# Patient Record
Sex: Male | Born: 1950
Health system: Southern US, Community
[De-identification: ages and names within clinical notes are randomized; demographics above are authoritative.]

## PROBLEM LIST (undated history)

## (undated) DIAGNOSIS — E785 Hyperlipidemia, unspecified: Secondary | ICD-10-CM

## (undated) DIAGNOSIS — I2510 Atherosclerotic heart disease of native coronary artery without angina pectoris: Secondary | ICD-10-CM

## (undated) DIAGNOSIS — G4752 REM sleep behavior disorder: Secondary | ICD-10-CM

## (undated) DIAGNOSIS — I119 Hypertensive heart disease without heart failure: Secondary | ICD-10-CM

## (undated) DIAGNOSIS — K635 Polyp of colon: Secondary | ICD-10-CM

## (undated) DIAGNOSIS — S5290XA Unspecified fracture of unspecified forearm, initial encounter for closed fracture: Secondary | ICD-10-CM

## (undated) DIAGNOSIS — K5790 Diverticulosis of intestine, part unspecified, without perforation or abscess without bleeding: Secondary | ICD-10-CM

## (undated) HISTORY — DX: Polyp of colon: K63.5

## (undated) HISTORY — PX: COLONOSCOPY: SHX174

## (undated) HISTORY — DX: Hypertensive heart disease without heart failure: I11.9

## (undated) HISTORY — DX: Hyperlipidemia, unspecified: E78.5

## (undated) HISTORY — DX: Unspecified fracture of unspecified forearm, initial encounter for closed fracture: S52.90XA

## (undated) HISTORY — PX: CORONARY STENT PLACEMENT: SHX1402

## (undated) HISTORY — DX: REM sleep behavior disorder: G47.52

## (undated) HISTORY — PX: WISDOM TOOTH EXTRACTION: SHX21

## (undated) HISTORY — PX: OTHER SURGICAL HISTORY: SHX169

## (undated) HISTORY — DX: Atherosclerotic heart disease of native coronary artery without angina pectoris: I25.10

## (undated) HISTORY — DX: Diverticulosis of intestine, part unspecified, without perforation or abscess without bleeding: K57.90

---

## 2005-01-28 ENCOUNTER — Ambulatory Visit: Payer: Self-pay | Admitting: Internal Medicine

## 2005-02-17 ENCOUNTER — Encounter (INDEPENDENT_AMBULATORY_CARE_PROVIDER_SITE_OTHER): Payer: Self-pay | Admitting: *Deleted

## 2005-02-17 ENCOUNTER — Ambulatory Visit: Payer: Self-pay | Admitting: Internal Medicine

## 2010-02-01 ENCOUNTER — Encounter: Payer: Self-pay | Admitting: Internal Medicine

## 2010-03-15 ENCOUNTER — Encounter (INDEPENDENT_AMBULATORY_CARE_PROVIDER_SITE_OTHER): Payer: Self-pay | Admitting: *Deleted

## 2010-03-18 ENCOUNTER — Ambulatory Visit: Payer: Self-pay | Admitting: Internal Medicine

## 2010-04-02 ENCOUNTER — Ambulatory Visit: Payer: Self-pay | Admitting: Internal Medicine

## 2010-04-06 ENCOUNTER — Encounter: Payer: Self-pay | Admitting: Internal Medicine

## 2010-08-17 NOTE — Procedures (Signed)
Summary: Colonoscopy  Patient: [name] Note: All result statuses are Final unless otherwise noted.  Tests: (1) Colonoscopy (COL)   COL Colonoscopy           DONE     [facility]     [address]     [place], [place]  [address]           COLONOSCOPY PROCEDURE REPORT           PATIENT:  [name], [name]  MR#:  [id number]     BIRTHDATE:  [birth year], 59 yrs. old  GENDER:  male     ENDOSCOPIST:  Wilhemina Bonito. Eda Keys, MD     REF. BY:  Surveillance Program Recall,     PROCEDURE DATE:  04/02/2010     PROCEDURE:  Colonoscopy with snare polypectomy x 1     ASA CLASS:  Class I     INDICATIONS:  history of polyps, surveillance and high-risk     screening ; 02-2005 - SMALL POLYP w/o path     MEDICATIONS:   Fentanyl 100 mcg IV, Versed 10 mg IV, Benadryl 50     mg IV           DESCRIPTION OF PROCEDURE:   After the risks benefits and     alternatives of the procedure were thoroughly explained, informed     consent was obtained.  Digital rectal exam was performed and     revealed no abnormalities.   The [id number] [id number] endoscope was     introduced through the anus and advanced to the cecum, which was     identified by both the appendix and ileocecal valve, without     limitations.Time to cecum = 3:15 min.   The quality of the prep     was excellent, using MoviPrep.  The instrument was then slowly     withdrawn (time = 12:01 min) as the colon was fully examined.     <<PROCEDUREIMAGES>>           FINDINGS:  A diminutive polyp was found in the ascending colon.     Polyp was snared without cautery. Retrieval was successful.  Mild     diverticulosis was found in the sigmoid colon.  This was otherwise     a normal examination of the colon.   Retroflexed views in the     rectum revealed no abnormalities.    The scope was then withdrawn     from the patient and the procedure completed.           COMPLICATIONS:  None     ENDOSCOPIC IMPRESSION:     1) Diminutive polyp in the ascending colon -  removed     2) Mild diverticulosis in the sigmoid colon     3) Otherwise normal examination           RECOMMENDATIONS:     1) Repeat colonoscopy in 5 years if polyp adenomatous; otherwise     10 years           ______________________________     Wilhemina Bonito. Eda Keys, MD           CC:  Creola Corn, MD; The Patient           n.     eSIGNED:   Wilhemina Bonito. Eda Keys at 04/02/2010 12:47 PM           [name], [id number]  Note: An exclamation Karlin (!) indicates a result  that was not dispersed into the flowsheet. Document Creation Date: 04/02/2010 12:48 PM _______________________________________________________________________  (1) Order result status: Final Collection or observation date-time: 04/02/2010 12:39 Requested date-time:  Receipt date-time:  Reported date-time:  Referring Physician:   Ordering Physician: Fransico Setters [phone]) Specimen Source:  Source: Launa Grill Order Number: [id number] Lab site:   Appended Document: Colonoscopy recall     Procedures Next Due Date:    Colonoscopy: 03/2015

## 2010-08-17 NOTE — Miscellaneous (Signed)
Summary: [facility]  Clinical Lists Changes  Medications: Added new medication of MOVIPREP 100 GM  SOLR (PEG-KCL-NACL-NASULF-NA ASC-C) As per prep instructions. - Signed Rx of MOVIPREP 100 GM  SOLR (PEG-KCL-NACL-NASULF-NA ASC-C) As per prep instructions.;  #1 x 0;  Signed;  Entered by: Ezra Sites RN;  Authorized by: Hilarie Fredrickson MD;  Method used: [facility] [phone]*, [address] [place], [place], [place]  [address], Ph: [phone] or [phone], Fax: [phone] Observations: Added new observation of NKA: T (03/18/2010 8:36)    Prescriptions: MOVIPREP 100 GM  SOLR (PEG-KCL-NACL-NASULF-NA ASC-C) As per prep instructions.  #1 x 0   Entered by:   Ezra Sites RN   Authorized by:   Hilarie Fredrickson MD   Signed by:   Ezra Sites RN on 03/18/2010   Method used:   Electronically to        [phone][facility] (retail)       [address]       [place], [place]  [address]       Ph: [phone] or [phone]       Fax: [phone]   RxID:   [id number]

## 2010-08-17 NOTE — Letter (Signed)
Summary: Moviprep Instructions  [facility]  [address]   [place], [place] [address]   Phone: [phone]  Fax: [phone]       Tionne Manship    1951-06-18    MRN: [id number]        Procedure Day Dorna Bloom: Friday, 04-02-10     Arrival Time: 10:00 a.m.     Procedure Time: 11:00 a.m.     Location of Procedure:                    x   [facility] (4th Floor)                        PREPARATION FOR COLONOSCOPY WITH MOVIPREP   Starting 5 days prior to your procedure 03-28-10 do not eat nuts, seeds, popcorn, corn, beans, peas,  salads, or any raw vegetables.  Do not take any fiber supplements (e.g. Metamucil, Citrucel, and Benefiber).  THE DAY BEFORE YOUR PROCEDURE         DATE:  04-01-10   DAY: Thursday  1.  Drink clear liquids the entire day-NO SOLID FOOD  2.  Do not drink anything colored red or purple.  Avoid juices with pulp.  No orange juice.  3.  Drink at least 64 oz. (8 glasses) of fluid/clear liquids during the day to prevent dehydration and help the prep work efficiently.  CLEAR LIQUIDS INCLUDE: Water Jello Ice Popsicles Tea (sugar ok, no milk/cream) Powdered fruit flavored drinks Coffee (sugar ok, no milk/cream) Gatorade Juice: apple, white grape, white cranberry  Lemonade Clear bullion, consomm, broth Carbonated beverages (any kind) Strained chicken noodle soup Hard Candy                             4.  In the morning, mix first dose of MoviPrep solution:    Empty 1 Pouch A and 1 Pouch B into the disposable container    Add lukewarm drinking water to the top line of the container. Mix to dissolve    Refrigerate (mixed solution should be used within 24 hrs)  5.  Begin drinking the prep at 5:00 p.m. The MoviPrep container is divided by 4 marks.   Every 15 minutes drink the solution down to the next Horacio (approximately 8 oz) until the full liter is complete.   6.  Follow completed prep with 16 oz of clear liquid of your choice  (Nothing red or purple).  Continue to drink clear liquids until bedtime.  7.  Before going to bed, mix second dose of MoviPrep solution:    Empty 1 Pouch A and 1 Pouch B into the disposable container    Add lukewarm drinking water to the top line of the container. Mix to dissolve    Refrigerate  THE DAY OF YOUR PROCEDURE      DATE: 04-02-10  DAY: Friday  Beginning at 6:00 a.m. (5 hours before procedure):         1. Every 15 minutes, drink the solution down to the next Tracie (approx 8 oz) until the full liter is complete.  2. Follow completed prep with 16 oz. of clear liquid of your choice.    3. You may drink clear liquids until  9:00 a.m.  (2 HOURS BEFORE PROCEDURE).   MEDICATION INSTRUCTIONS  Unless otherwise instructed, you should take regular prescription medications with a small sip of water   as early  as possible the morning of your procedure.          OTHER INSTRUCTIONS  You will need a responsible adult at least 59 years of age to accompany you and drive you home.   This person must remain in the waiting room during your procedure.  Wear loose fitting clothing that is easily removed.  Leave jewelry and other valuables at home.  However, you may wish to bring a book to read or  an iPod/MP3 player to listen to music as you wait for your procedure to start.  Remove all body piercing jewelry and leave at home.  Total time from sign-in until discharge is approximately 2-3 hours.  You should go home directly after your procedure and rest.  You can resume normal activities the  day after your procedure.  The day of your procedure you should not:   Drive   Make legal decisions   Operate machinery   Drink alcohol   Return to work  You will receive specific instructions about eating, activities and medications before you leave.    The above instructions have been reviewed and explained to me by  Ezra Sites RN  March 18, 2010 9:15 AM      I fully  understand and can verbalize these instructions _____________________________ Date _________

## 2010-08-17 NOTE — Letter (Signed)
Summary: Patient Notice- Polyp Results  [facility]  [address] [place], [place] [address]   Phone: [phone]  Fax: [phone]        April 06, 2010 MRN: [id number]    [facility] [address] [place], [place]  [address]    Dear Mr. RIDDLE,  I am pleased to inform you that the colon polyp(s) removed during your recent colonoscopy was (were) found to be benign (no cancer detected) upon pathologic examination.  I recommend you have a repeat colonoscopy examination in 5 years to look for recurrent polyps, as having colon polyps increases your risk for having recurrent polyps or even colon cancer in the future.  Should you develop new or worsening symptoms of abdominal pain, bowel habit changes or bleeding from the rectum or bowels, please schedule an evaluation with either your primary care physician or with me.  Additional information/recommendations:  __ No further action with gastroenterology is needed at this time. Please      follow-up with your primary care physician for your other healthcare      needs.    Please call us if you are having persistent problems or have questions about your condition that have not been fully answered at this time.  Sincerely,  Hilarie Fredrickson MD  This letter has been electronically signed by your physician.  Appended Document: Patient Notice- Polyp Results letter mailed

## 2010-08-17 NOTE — Letter (Signed)
Summary: Colonoscopy Letter  [facility]  [address] [place], [place] [address]   Phone: [phone]  Fax: [phone]      February 01, 2010 MRN: [id number]   [facility] [address] [place], [place]  [address]   Dear [name],   According to your medical record, it is time for you to schedule a Colonoscopy. The American Cancer Society recommends this procedure as a method to detect early colon cancer. Patients with a family history of colon cancer, or a personal history of colon polyps or inflammatory bowel disease are at increased risk.  This letter has been generated based on the recommendations made at the time of your procedure. If you feel that in your particular situation this may no longer apply, please contact our office.  Please call our office at [phone] to schedule this appointment or to update your records at your earliest convenience.  Thank you for cooperating with [place] to provide you with the very best care possible.   Sincerely,  Wilhemina Bonito. Marina Goodell, M.D.  [facility]astro[facility] [phone]

## 2011-04-16 ENCOUNTER — Emergency Department (HOSPITAL_COMMUNITY): Admission: EM | Admit: 2011-04-16 | Payer: Self-pay | Source: Home / Self Care | Admitting: Emergency Medicine

## 2011-04-16 ENCOUNTER — Emergency Department (HOSPITAL_COMMUNITY): Payer: 59

## 2011-04-16 ENCOUNTER — Inpatient Hospital Stay (HOSPITAL_COMMUNITY)
Admission: EM | Admit: 2011-04-16 | Discharge: 2011-04-19 | DRG: 248 | Disposition: A | Discharge status: Home / Self Care | Payer: 59 | Attending: Cardiovascular Disease | Admitting: Cardiovascular Disease

## 2011-04-16 ENCOUNTER — Inpatient Hospital Stay (HOSPITAL_COMMUNITY): Payer: 59

## 2011-04-16 DIAGNOSIS — Z23 Encounter for immunization: Secondary | ICD-10-CM

## 2011-04-16 DIAGNOSIS — I4901 Ventricular fibrillation: Secondary | ICD-10-CM | POA: Diagnosis not present

## 2011-04-16 DIAGNOSIS — I442 Atrioventricular block, complete: Secondary | ICD-10-CM | POA: Diagnosis not present

## 2011-04-16 DIAGNOSIS — I2119 ST elevation (STEMI) myocardial infarction involving other coronary artery of inferior wall: Principal | ICD-10-CM | POA: Diagnosis present

## 2011-04-16 DIAGNOSIS — I2510 Atherosclerotic heart disease of native coronary artery without angina pectoris: Secondary | ICD-10-CM | POA: Diagnosis present

## 2011-04-16 DIAGNOSIS — Z8249 Family history of ischemic heart disease and other diseases of the circulatory system: Secondary | ICD-10-CM

## 2011-04-16 DIAGNOSIS — R569 Unspecified convulsions: Secondary | ICD-10-CM | POA: Diagnosis not present

## 2011-04-16 DIAGNOSIS — E876 Hypokalemia: Secondary | ICD-10-CM | POA: Diagnosis present

## 2011-04-16 LAB — COMPREHENSIVE METABOLIC PANEL
ALT: 11 U/L (ref 0–53)
Alkaline Phosphatase: 60 U/L (ref 39–117)
BUN: 10 mg/dL (ref 6–23)
CO2: 20 mEq/L (ref 19–32)
CO2: 21 mEq/L (ref 19–32)
Calcium: 8.7 mg/dL (ref 8.4–10.5)
Chloride: 107 mEq/L (ref 96–112)
Creatinine, Ser: 0.73 mg/dL (ref 0.50–1.35)
GFR calc Af Amer: >60 mL/min (ref >60)
GFR calc non Af Amer: >60 mL/min (ref >60)
GFR calc non Af Amer: >60 mL/min (ref >60)
Glucose, Bld: 142 mg/dL — ABNORMAL HIGH (ref 70–99)
Potassium: 4.4 mEq/L (ref 3.5–5.1)
Sodium: 138 mEq/L (ref 135–145)
Total Bilirubin: 0.4 mg/dL (ref 0.3–1.2)
Total Bilirubin: 0.4 mg/dL (ref 0.3–1.2)

## 2011-04-16 LAB — CBC
HCT: 39.1 % (ref 39.0–52.0)
Hemoglobin: 14.1 g/dL (ref 13.0–17.0)
Hemoglobin: 13.1 g/dL (ref 13.0–17.0)
MCH: 34 pg (ref 26.0–34.0)
MCHC: 36.1 g/dL — ABNORMAL HIGH (ref 30.0–36.0)
MCHC: 35.7 g/dL (ref 30.0–36.0)
Platelets: 162 10*3/uL (ref 150–400)
RDW: 12 % (ref 11.5–15.5)
RDW: 12.1 % (ref 11.5–15.5)

## 2011-04-16 LAB — BASIC METABOLIC PANEL
BUN: 11 mg/dL (ref 6–23)
Calcium: 8.1 mg/dL — ABNORMAL LOW (ref 8.4–10.5)
Creatinine, Ser: 0.86 mg/dL (ref 0.50–1.35)
GFR calc Af Amer: >60 mL/min (ref >60)
GFR calc non Af Amer: >60 mL/min (ref >60)
Potassium: 3.7 mEq/L (ref 3.5–5.1)

## 2011-04-16 LAB — POCT I-STAT TROPONIN I: Troponin i, poc: 0.01 ng/mL (ref 0.00–0.08)

## 2011-04-16 LAB — POCT I-STAT, CHEM 8
Calcium, Ion: 1.08 mmol/L — ABNORMAL LOW (ref 1.12–1.32)
Chloride: 105 mEq/L (ref 96–112)
Creatinine, Ser: 1.1 mg/dL (ref 0.50–1.35)
Glucose, Bld: 138 mg/dL — ABNORMAL HIGH (ref 70–99)
Potassium: 2.7 mEq/L — CL (ref 3.5–5.1)

## 2011-04-16 LAB — URINALYSIS, ROUTINE W REFLEX MICROSCOPIC
Bilirubin Urine: NEGATIVE
Glucose, UA: NEGATIVE mg/dL
Ketones, ur: 15 mg/dL — AB
Leukocytes, UA: NEGATIVE
Nitrite: NEGATIVE
Specific Gravity, Urine: 1.045 — ABNORMAL HIGH (ref 1.005–1.030)
pH: 7 (ref 5.0–8.0)

## 2011-04-16 LAB — LIPID PANEL
Cholesterol: 165 mg/dL (ref 0–200)
Triglycerides: 65 mg/dL (ref <150)

## 2011-04-16 LAB — CARDIAC PANEL(CRET KIN+CKTOT+MB+TROPI)
CK, MB: 81.4 ng/mL (ref 0.3–4.0)
Total CK: 1654 U/L — ABNORMAL HIGH (ref 7–232)

## 2011-04-17 LAB — BASIC METABOLIC PANEL
CO2: 24 mEq/L (ref 19–32)
Calcium: 8.3 mg/dL — ABNORMAL LOW (ref 8.4–10.5)
Chloride: 105 mEq/L (ref 96–112)
Glucose, Bld: 104 mg/dL — ABNORMAL HIGH (ref 70–99)
Sodium: 135 mEq/L (ref 135–145)

## 2011-04-17 LAB — CBC
Hemoglobin: 12.8 g/dL — ABNORMAL LOW (ref 13.0–17.0)
MCH: 33.1 pg (ref 26.0–34.0)
Platelets: 134 10*3/uL — ABNORMAL LOW (ref 150–400)
RBC: 3.87 MIL/uL — ABNORMAL LOW (ref 4.22–5.81)
WBC: 11.1 10*3/uL — ABNORMAL HIGH (ref 4.0–10.5)

## 2011-04-17 LAB — CARDIAC PANEL(CRET KIN+CKTOT+MB+TROPI)
CK, MB: 20.9 ng/mL (ref 0.3–4.0)
Relative Index: 0.9 (ref 0.0–2.5)
Total CK: 2437 U/L — ABNORMAL HIGH (ref 7–232)
Troponin I: 14.8 ng/mL (ref <0.30)

## 2011-04-17 LAB — HEPARIN LEVEL (UNFRACTIONATED): Heparin Unfractionated: 0.33 IU/mL (ref 0.30–0.70)

## 2011-04-18 LAB — POCT ACTIVATED CLOTTING TIME: Activated Clotting Time: 325 seconds

## 2011-04-19 LAB — BASIC METABOLIC PANEL
BUN: 8 mg/dL (ref 6–23)
Calcium: 9 mg/dL (ref 8.4–10.5)
Creatinine, Ser: 0.73 mg/dL (ref 0.50–1.35)
GFR calc Af Amer: >90 mL/min (ref >90)
GFR calc non Af Amer: >90 mL/min (ref >90)

## 2011-04-20 NOTE — H&P (Signed)
[name], [name]                 ACCOUNT NO.:  [id number]  MEDICAL RECORD NO.:  [id number]  LOCATION:  2911                         FACILITY:  [facility]  PHYSICIAN:  Nicki Guadalajara, M.D.     DATE OF BIRTH:  1950/07/20  DATE OF ADMISSION:  04/16/2011 DATE OF DISCHARGE:                             HISTORY & PHYSICAL   CHIEF COMPLAINT:  Chest pain.  HISTORY OF PRESENT ILLNESS:  [name] is a pleasant 60 year old male with no prior history of coronary disease and in fact no serious medical problems.  He actually was supposed to see Dr. Timothy Lasso next week for his annual physical.  He has no history of chest pain or unusual shortness of breath.  He was awakened early this morning with chest pain.  He went to the bathroom to get a drink of water and his pain persisted.  It intensified to the fact where he had to lay down on the floor.  His wife woke up and called 9-1-1.  EKG by EMS showed inferior ST elevation.  He was treated as an ST elevation MI and transferred urgently to [facility] where he was seen by Dr. Tresa Endo in the cath lab.  The patient was taken urgently to the cath lab by Dr. Tresa Endo for further evaluation.  PAST MEDICAL HISTORY:  Unremarkable for any major surgeries.  He denies any history of diabetes, hypertension, or dyslipidemia.  MEDICATIONS:  He is on no medications.  ALLERGIES:  He has no known drug allergies.  SOCIAL HISTORY:  He is a nonsmoker.  He is married.  He has 1 grown child, 6 years old.  He works in [employer]  He does not exercise.  He admits his diet is poor, he eats fast food frequently.  FAMILY HISTORY:  Remarkable that his mother has some coronary disease, this was first noted in her 52s.  She has 1 sister without any serious medical problems.  REVIEW OF SYSTEMS:  Essentially unremarkable except for noted above, he does admit to some recent stress.  He says his mother has recently been diagnosed with stomach cancer.  He says that he is under great deal  of stress from work.  He denies any GI bleeding or melena.  He has had no kidney problems.  He has not had recent fever, chills, or illness.  PHYSICAL EXAMINATION:  VITAL SIGNS:  Blood pressure was 98/57, pulse 66,and  respirations 16. GENERAL:  He is a well-developed, well-nourished male in no acute distress. HEENT:  He is normocephalic.  Extraocular movements are intact.  Sclerae nonicteric.  Lids and conjunctivae are within normal limits. NECK:  Without JVD or bruit. CHEST:  Clear to auscultation and percussion. CARDIAC:  Regular rate and rhythm without murmur, rub, or gallop. Normal S1 and S2. ABDOMEN:  Nontender, nondistended. EXTREMITIES:  Without edema.  Distal pulses are intact. NEURO:  Grossly intact. SKIN:  Cool and moist.  IMPRESSION: 1. Inferior ST elevation myocardial infarction. 2. Family history of coronary disease. 3. Unknown lipid status.  PLAN:  The patient was taken to cath lab for urgent catheterization by Dr. Tresa Endo.     Abelino Derrick, P.A.   ______________________________ Maisie Fus  Tresa Endo, M.D.    Lenard Lance  D:  04/16/2011  T:  04/16/2011  Job:  [id number]  cc:   Gwen Pounds, MD  Electronically Signed by Corine Shelter P.A. on 04/16/2011 06:09:10 PM Electronically Signed by Nicki Guadalajara M.D. on 04/20/2011 01:06:54 PM

## 2011-04-20 NOTE — Cardiovascular Report (Signed)
NAMERAPHEL, STICKLES NO.:  [id number]  MEDICAL RECORD NO.:  [id number]  LOCATION:  2911                         FACILITY:  [facility]  PHYSICIAN:  Nicki Guadalajara, M.D.     DATE OF BIRTH:  Dec 04, 1950  DATE OF PROCEDURE: DATE OF DISCHARGE:                           CARDIAC CATHETERIZATION   INDICATIONS:  [name] is a 60 year old gentleman without known prior cardiac history.  Approximately midnight this morning, he was awakened from sleep with severe substernal chest pain.  He went to the bathroom.  Chest pain persisted.  It became very severe.  EMS was then called by the patient's wife.  ECG was shown to have acute inferolateral ST-segment elevation and a Code STEMI  was called.  The patient arrived to [facility] Emergency Room at approximally [phone].  Once the cath lab team arrived, the patient was immediately brought to the cardiac catheterization laboratory and rolled into the catheterization suite at 0058.  As the patient was being prepped, he developed complete heart block.  Consequently, right femoral vein was initially punctured and transvenous pacemaker was immediately inserted.  At this time, the patient developed recurrent episodes of ventricular fibrillation.  He required multiple countershocks and kept going in and out of ventricular fibrillation.  Transient CPR was also performed.  He then had a seizure activity.  At this time, he also was given 300 mg of IV amiodarone. __________ right femoral artery, 6-French sheath was inserted and immediately inserted a 6-French right guide with the presumption that it was an occlusion of a large RCA vessel.  Initial injection confirmed subtotal very proximal RCA occlusion and then this was completely occluded with TIMI 0 flow distally.  Prowater wire was immediately advanced down the vessel.  In the emergency room, the patient had previously received 5000 units of heparin.  A 2.0 emerge balloon was then  inserted and dilatation was done with restoration of TIMI 3 flow. Once flow was reestablished, the patient's rhythm became more stable. However, due to his poor ventilatory capacity at this time, there were initial attempts neither was an attempt to intubate the patient by anesthesia.  The patient then had protracted vomiting.  Ultimately, he did awaken and consequently it was elected not to intubate the patient presently.  Relook at the vessel then showed that the vessel was trying to reocclude.  A 3.0 x 15-mm emerge balloon was then inserted and dilatation was done in the proximal RCA.  The vessel was a very large vessel.  At this point, due to the urgency of the situation and without knowing what the left coronary system was like, decision was made to insert a bare-metal stent in this very large vessel.  A 4.0 x 22-mm Medtronic integrity stent was then inserted and dilated x2 at 11 and 12 atmospheres.  A 4.5 x 15-mm noncompliant Trek balloon was used for post- stent dilatation up to 4.38 mm.  Scout angiography confirmed an excellent angiographic result with restoration of TIMI 3 flow in a very large caliber, very dominant right coronary artery.  The balloon, wire, and right catheter were then removed.  Attention was then directed at the left coronary system.  A diagnostic 6-French left catheter was then inserted and selective angiography in the left coronary artery was performed.  This was then removed and a 6-French pigtail catheter was used for biplane cine left ventriculography as well as distal aortography.  During the procedure, once the patient had awakened, he did receive 180 mg of Brilinta orally.  He also had received Zofran 4 mg.  He did receive an additional 150 mg bolus of amiodarone for a total dose of 450 mg and was started on an amiodarone drip.  The arterial sheath and venous sheath were sutured in place.  During the procedure, the patient was paced almost throughout the  procedure.  At the end of the procedure, his pacemaker was turned down to 55 beats per minute.  He was transported to the coronary care unit with stable hemodynamics, now with a blood pressure of 103/70, very alert and oriented.Marland Kitchen  HEMODYNAMIC DATA:  Central aortic pressure was 80/60.  Left ventricular pressure 80/17.  Central aortic pressure did decrease to 74/54 at its nadir.  ANGIOGRAPHIC DATA:  The right coronary artery initial injection had 99% proximal stenosis and then essentially had TIMI 0 flow in the mid segment.  Following intervention, at completion of the interventional procedure, the right coronary artery was a very large dominant vessel, which supplied a very large acute marginal branch, PDA, and large posterolateral system supplying the entire inferior inferolateral wall. The 99% and 100% occlusion was reduced to 0% with dilatation of a 4.0 x 22-mm integrity stent postdilated to 4.38 mm.  There was brisk TIMI 3 flow.  The left main coronary was angiographically normal and trifurcated into an LAD, a large ramus intermediate system, and left circumflex system.  The LAD was free of significant disease and gave rise to several septal perforating arteries and a smaller mid diagonal vessel.  The LAD extended to the apex.  The intermediate vessel was angiographically normal and extended to the distal portion of the anterolateral wall.  The circumflex vessel was angiographically normal and gave rise to 1 major bifurcating marginal vessel.  Biplane selective artery revealed preserved global contractility with an acute ejection fraction of at least 55%.  On the RAO projection, there was a small area of mid to basal focal hypocontractility.  On the LAO projection, contractility appeared normal.  Distal aortography revealed mild tortuosity of the infrarenal aorta without stenoses.  Renal arteries were widely patent.  TIMELINE SUMMARY:  Chest pain onset approximately  midnight; the patient's arrival to [facility] Emergency Room approximally 00:48 a.m.; the patient's arrival to the cardiac catheterization laboratory 00:58 a.m.  Device deployment with initial balloon inflation of 129 giving a door-to-balloon time from the arrival in the catheterization laboratory 31 minutes, 41 minutes from arrival to the emergency room, and 1 hour and 29 minutes from chest pain onset originating at home.  IMPRESSION: 1. Acute high-risk inferolateral ST-segment elevation myocardial     infarction secondary to total proximal occlusion of a very large     dominant right coronary artery. 2. Complete heart block requiring temporary pacemaker insertion. 3. Recurrent episodes of ventricular fibrillation requiring multiple     defibrillations with transient cardiopulmonary resuscitation. 4. Transient seizure with subsequent postictal state and an initial     attempt at intubation, ultimately aborted with resumption of normal     ventilatory capacity. 5. Total proximal right coronary artery occlusion. 6. Normal left coronary system. 7. Successful percutaneous transluminal coronary angioplasty/stenting     of the right coronary artery  with ultimate insertion of a 4.0 x 22-     mm integrity stent postdilated to 4.38 mm. ______________________________ Nicki Guadalajara, M.D.     TK/MEDQ  D:  04/16/2011  T:  04/16/2011  Job:  [id number]  cc:   Gwen Pounds, MD  Electronically Signed by Nicki Guadalajara M.D. on 04/20/2011 01:06:48 PM

## 2011-04-25 NOTE — Discharge Summary (Signed)
[name], [name]                 ACCOUNT NO.:  [id number]  MEDICAL RECORD NO.:  [id number]  LOCATION:  2007                         FACILITY:  [facility]  PHYSICIAN:  Landry Corporal, MD DATE OF BIRTH:  February 16, 1951  DATE OF ADMISSION:  04/16/2011 DATE OF DISCHARGE:  04/19/2011                              DISCHARGE SUMMARY   DISCHARGE DIAGNOSES: 1. ST-elevation myocardial infarction, status post percutaneous     coronary intervention with an integrity stent, bare metal, to the     right coronary artery. 2. Ventricular fibrillation requiring multiple shocks. 3. Complete heart block, currently resolved, required temporary     pacing. 4. Hypokalemia.  HOSPITAL COURSE:  [name] is a 60 year old Caucasian male with no prior history of coronary artery disease and no prior serious medical problems.  He had been awakened with chest pain in the morning of admission.  Upon EMS arrival, an EKG showed inferior ST elevation, was transferred urgently to the [facility], and was taken to the cath lab by Dr. Tresa Endo.  Catheterization showed a 99% blockage in the very dominant RCA. This was stented with integrity bare-metal stent.  During the course of catheterization, the patient required temporary pacemaker insertion due to complete heart block.  He also had recurrent episodes of ventricular fibrillation requiring multiple defibrillations with transient cardiopulmonary resuscitation.  Also had transient seizure with subsequent postictal state.  There was initial attempt at intubation, which was ultimately aborted with resumption of normal ventilatory capacity.  On April 16, 2011, the patient was hypokalemic with a potassium value of 2.6.  This was repleted.  The patient continued through his hospital stay without any chest pain or shortness of breath. Cardiac rehabilitation was initiated.  His Lopressor was titrated up to 25 mg q.12 h.  He was also transferred in the CCU to 2000.   Currently, the patient had been seen by Dr. Herbie Baltimore to discharge home and get outpatient echocardiogram.  He will follow up with Dr. Tresa Endo approximately in 2 weeks.  DISCHARGE LABORATORY DATA:  WBC is 11.1, hemoglobin 12.8, hematocrit 37.1, platelets 134.  Sodium 137, potassium 3.7, chloride 104, carbon dioxide 25, glucose 92, BUN 8, creatinine 0.73, calcium 9.0.  Total creatinine kinase of 2437, peak; CK-MB of 81.4, peak; and troponin peak of 25.00.  Total cholesterol 165, triglycerides 65, HDL 49, LDL 103 and VLDL 13, total cholesterol and HDL ratio of 3.4.  TSH 0.566.  Initial urinalysis showed ketones of 15, MRSA negative.  STUDIES/PROCEDURES:  Chest x-ray on April 16, 2011 showed normal heart size, clear lungs.  No pneumothorax or pleural fluid.  No active cardiopulmonary disease.  Cardiac catheterization on April 16, 2011.  Impression:  Acute high- risk inferolateral ST-segment elevation myocardial infarction secondary to total proximal occlusion of very large dominant right coronary artery.  Complete heart block requiring temporary pacemaker insertion. Recurrent episodes of ventricular fibrillation requiring multiple defibrillations with transient cardiopulmonary resuscitation.  Transient seizure with subsequent postictal state and initial attempt at intubation ultimately aborted with resumption of normal ventilatory capacity.  Still proximal right coronary artery occlusion, normal left coronary system.  Status post successful percutaneous transluminal coronary angioplasty stenting of the right coronary artery  with an ultimate insertion of a 4.0 x 22-mm integrity stent postdilated to 4.38 mm.  Ejection fraction of at least 55%.  DISCHARGE MEDICATIONS: 1. Aspirin 81 mg 1 tablet by mouth daily. 2. Metoprolol 25 mg 1 tablet by mouth twice daily. 3. Ramipril 2.5 mg 1 tablet by mouth daily. 4. Rosuvastatin 20 mg 1 tablet by mouth daily. 5. Ticagrelor 90 mg 1 tablet by  mouth twice daily.  DISPOSITION:  [name] will be discharged home in stable condition. It is recommended he increase his activity slowly.  May shower and bathe.  No lifting or driving for 3 days.  It is Recommended he eats a heart-healthy diet, low in carbohydrate.  He will follow with Dr. Tresa Endo and our office will call him with the appointment time.  It is recommended he returns to work on May 02, 2011 after being seen by Dr. Tresa Endo in followup.  He will also get a followup 2-D echocardiogram and office will call him with an appointment.    ______________________________ Wilburt Finlay, PA   ______________________________ Landry Corporal, MD    BH/MEDQ  D:  04/19/2011  T:  04/20/2011  Job:  [id number]  cc:   Nicki Guadalajara, M.D. Gwen Pounds, MD  Electronically Signed by Wilburt Finlay PA on 04/22/2011 10:41:11 AM Electronically Signed by Bryan Lemma MD on 04/25/2011 10:49:51 PM

## 2011-08-17 ENCOUNTER — Ambulatory Visit
Admission: RE | Admit: 2011-08-17 | Discharge: 2011-08-17 | Disposition: A | Discharge status: Home / Self Care | Payer: 59 | Source: Ambulatory Visit | Attending: Cardiovascular Disease | Admitting: Cardiovascular Disease

## 2011-08-17 ENCOUNTER — Other Ambulatory Visit: Payer: Self-pay | Admitting: Cardiovascular Disease

## 2011-08-17 DIAGNOSIS — R0781 Pleurodynia: Secondary | ICD-10-CM

## 2012-05-16 ENCOUNTER — Encounter: Payer: Self-pay | Admitting: Internal Medicine

## 2012-05-16 NOTE — Telephone Encounter (Signed)
ERROR/YF

## 2012-06-19 ENCOUNTER — Ambulatory Visit (INDEPENDENT_AMBULATORY_CARE_PROVIDER_SITE_OTHER): Payer: 59 | Admitting: Internal Medicine

## 2012-06-19 ENCOUNTER — Encounter: Payer: Self-pay | Admitting: Internal Medicine

## 2012-06-19 VITALS — BP 110/60 | HR 66 | Ht 72.0 in | Wt 184.5 lb

## 2012-06-19 DIAGNOSIS — F458 Other somatoform disorders: Secondary | ICD-10-CM

## 2012-06-19 DIAGNOSIS — Z86010 Personal history of colonic polyps: Secondary | ICD-10-CM

## 2012-06-19 DIAGNOSIS — R0989 Other specified symptoms and signs involving the circulatory and respiratory systems: Secondary | ICD-10-CM

## 2012-06-19 DIAGNOSIS — Z800 Family history of malignant neoplasm of digestive organs: Secondary | ICD-10-CM

## 2012-06-19 DIAGNOSIS — K219 Gastro-esophageal reflux disease without esophagitis: Secondary | ICD-10-CM

## 2012-06-19 NOTE — Patient Instructions (Addendum)
You have been scheduled for an endoscopy with propofol. Please follow written instructions given to you at your visit today. If you use inhalers (even only as needed) or a CPAP machine, please bring them with you on the day of your procedure. 

## 2012-06-19 NOTE — Progress Notes (Signed)
HISTORY OF PRESENT ILLNESS:  [name] is a 61 y.o. male with a history of coronary artery disease with prior myocardial infarction and coronary artery stent placement. He was seen, as a direct referral in September of 2011 for screening colonoscopy. Index exam in 2006 revealed a small polyp without pathology. The 2011 examination revealed mild diverticulosis and a diminutive tubular adenoma which was removed. Followup in 5 years recommended. He is referred today, by Dr. Creola Corn, regarding problems with globus sensation, possible GERD, and a family history of esophageal cancer. The patient reports that he noticed a sensation that his throat was enlarging, this summer. No true dysphagia. He saw Dr. Timothy Lasso. Negative physical exam. Expectant management without resolution. Subsequent ENT referral. Negative for ring oh exam without laryngoscopy reported. Diagnosed with GERD. Given GERD diet measures and prescribed omeprazole 20 mg twice a day. Seen in followup 1 month later with significant improvement in symptoms. Continued on PPI and referred to GI. He denies any significant symptoms at this time. He reports his mother being diagnosed with esophageal cancer approximately year ago. She died from the disease. Patient denies any classic reflux symptoms. No problems with sinus drainage or anxiety. No lower GI complaints  REVIEW OF SYSTEMS:  All non-GI ROS negative   Past Medical History  Diagnosis Date  . Colon polyps     adenomatous  . Diverticulosis   . Heart attack     Past Surgical History  Procedure Date  . Coronary stent placement     03/2011    Social History [name]  reports that he has never smoked. He has never used smokeless tobacco. He reports that he does not drink alcohol or use illicit drugs.  family history includes Cancer in his father and mother and Heart disease in his mother.  No Known Allergies     PHYSICAL EXAMINATION:  Vital signs: BP 110/60  Pulse 66  Ht 6'  (1.829 m)  Wt 184 lb 8 oz (83.689 kg)  BMI 25.02 kg/m2  SpO2 98% General: Well-developed, well-nourished, no acute distress HEENT: Sclerae are anicteric, conjunctiva pink. Oral mucosa intact. Posterior pharynx normal. Normal thyroid. No adenopathy. Lungs: Clear Heart: Regular Abdomen: soft, nontender, nondistended, no obvious ascites, no peritoneal signs, normal bowel sounds. No organomegaly. Extremities: No edema Psychiatric: alert and oriented x3. Cooperative     ASSESSMENT:  #1. Globus sensation, possibly GERD #2. Family history of esophageal cancer #3. Personal history of adenomatous colon polyp   PLAN:  #1. Continue reflux precautions #2. Continue PPI , for now #3. Diagnostic upper endoscopy.The nature of the procedure, as well as the risks, benefits, and alternatives were carefully and thoroughly reviewed with the patient. Ample time for discussion and questions allowed. The patient understood, was satisfied, and agreed to proceed.  #4. Surveillance colonoscopy around September 2016

## 2012-07-10 ENCOUNTER — Encounter: Payer: Self-pay | Admitting: Internal Medicine

## 2012-07-10 ENCOUNTER — Ambulatory Visit (AMBULATORY_SURGERY_CENTER): Payer: 59 | Admitting: Internal Medicine

## 2012-07-10 VITALS — BP 127/70 | HR 55 | Temp 96.3°F | Resp 8 | Ht 72.0 in | Wt 184.0 lb

## 2012-07-10 DIAGNOSIS — R1310 Dysphagia, unspecified: Secondary | ICD-10-CM

## 2012-07-10 DIAGNOSIS — K219 Gastro-esophageal reflux disease without esophagitis: Secondary | ICD-10-CM

## 2012-07-10 MED ORDER — SODIUM CHLORIDE 0.9 % IV SOLN: 500.0000 mL | INTRAVENOUS | Status: DC

## 2012-07-10 NOTE — Op Note (Signed)
[facility] [address] [place], [address]   ENDOSCOPY PROCEDURE REPORT  PATIENT: Devin, Watkins  MR#: [id number] BIRTHDATE: 14-Aug-1950 , 61  yrs. old GENDER: Male ENDOSCOPIST: Roxy Cedar, MD REFERRED BY:  Creola Corn, M.D. PROCEDURE DATE:  07/10/2012 PROCEDURE:  EGD, diagnostic ASA CLASS:     Class II INDICATIONS:  Globus sensation - ? esophageal reflux (now on PPI). Mother with hx esophageal Ca. MEDICATIONS: MAC sedation, administered by CRNA and propofol (Diprivan) 300mg  IV TOPICAL ANESTHETIC: Cetacaine Spray  DESCRIPTION OF PROCEDURE: After the risks benefits and alternatives of the procedure were thoroughly explained, informed consent was obtained.  The [facility] [id number] endoscope was introduced through the mouth and advanced to the second portion of the duodenum. Without limitations.  The instrument was slowly withdrawn as the mucosa was fully examined.      The upper, middle and distal third of the esophagus were carefully inspected and no abnormalities were noted.  The z-line was well seen at the GEJ.  The endoscope was pushed into the fundus which was normal including a retroflexed view.  The antrum (save mild erythema), gastric body, first and second part of the duodenum were unremarkable.  Retroflexed views revealed no abnormalities.     The scope was then withdrawn from the patient and the procedure completed.  COMPLICATIONS: There were no complications.  ENDOSCOPIC IMPRESSION: 1.Normal EGD  RECOMMENDATIONS: 1. Continue PPI. Reduce to once daily in January as discussed. Can wean further thereafter and observe for effects  REPEAT EXAM:  eSigned:  Roxy Cedar, MD 07/10/2012 12:57 PM   GM:WNUU Timothy Lasso, MD and The Patient

## 2012-07-10 NOTE — Progress Notes (Signed)
Patient did not experience any of the following events: a burn prior to discharge; a fall within the facility; wrong site/side/patient/procedure/implant event; or a hospital transfer or hospital admission upon discharge from the facility. (G8907) Patient did not have preoperative order for IV antibiotic SSI prophylaxis. (G8918)  

## 2012-07-10 NOTE — Patient Instructions (Addendum)
YOU HAD AN ENDOSCOPIC PROCEDURE TODAY AT THE [facility]: Refer to the procedure report that was given to you for any specific questions about what was found during the examination.  If the procedure report does not answer your questions, please call your gastroenterologist to clarify.  If you requested that your care partner not be given the details of your procedure findings, then the procedure report has been included in a sealed envelope for you to review at your convenience later.  YOU SHOULD EXPECT: Some feelings of bloating in the abdomen. Passage of more gas than usual.  Walking can help get rid of the air that was put into your GI tract during the procedure and reduce the bloating.  DIET: Your first meal following the procedure should be a light meal and then it is ok to progress to your normal diet.  A half-sandwich or bowl of soup is an example of a good first meal.  Heavy or fried foods are harder to digest and may make you feel nauseous or bloated.  Likewise meals heavy in dairy and vegetables can cause extra gas to form and this can also increase the bloating.  Drink plenty of fluids but you should avoid alcoholic beverages for 24 hours.  ACTIVITY: Your care partner should take you home directly after the procedure.  You should plan to take it easy, moving slowly for the rest of the day.  You can resume normal activity the day after the procedure however you should NOT DRIVE or use heavy machinery for 24 hours (because of the sedation medicines used during the test).    SYMPTOMS TO REPORT IMMEDIATELY: A gastroenterologist can be reached at any hour.  During normal business hours, 8:30 AM to 5:00 PM Monday through Friday, call [phone].  After hours and on weekends, please call the GI answering service at [phone] who will take a message and have the physician on call contact you.   Following upper endoscopy (EGD)  Vomiting of blood or coffee ground material  New  chest pain or pain under the shoulder blades  Painful or persistently difficult swallowing  New shortness of breath  Fever of 100F or higher  Black, tarry-looking stools  FOLLOW UP: Our staff will call the home number listed on your records the next business day following your procedure to check on you and address any questions or concerns that you may have at that time regarding the information given to you following your procedure. This is a courtesy call and so if there is no answer at the home number and we have not heard from you through the emergency physician on call, we will assume that you have returned to your regular daily activities without incident.  SIGNATURES/CONFIDENTIALITY: You and/or your care partner have signed paperwork which will be entered into your electronic medical record.  These signatures attest to the fact that that the information above on your After Visit Summary has been reviewed and is understood.  Full responsibility of the confidentiality of this discharge information lies with you and/or your care-partner.   Continue your normal medications  Continue your Omeprazole, reduce to once daily in January as discussed.  Can wean after that and observe effects

## 2012-07-13 ENCOUNTER — Telehealth: Payer: Self-pay | Admitting: *Deleted

## 2012-07-13 NOTE — Telephone Encounter (Signed)
  Follow up Call-  Call back number 07/10/2012  Post procedure Call Back phone  # [phone]  Permission to leave phone message Yes     Patient questions:  Do you have a fever, pain , or abdominal swelling? no Pain Score  0 *  Have you tolerated food without any problems? yes  Have you been able to return to your normal activities? yes  Do you have any questions about your discharge instructions: Diet   no Medications  no Follow up visit  no  Do you have questions or concerns about your Care? no  Actions: * If pain score is 4 or above: No action needed, pain <4.

## 2012-10-01 IMAGING — CR DG CHEST 2V
3 series · 3 of 3 positions shown · non-contrast
Comparison: 04/16/2011

CLINICAL DATA: Fall.  Chest pain.

CHEST - 2 VIEW

[view not recorded (1 of 3)]
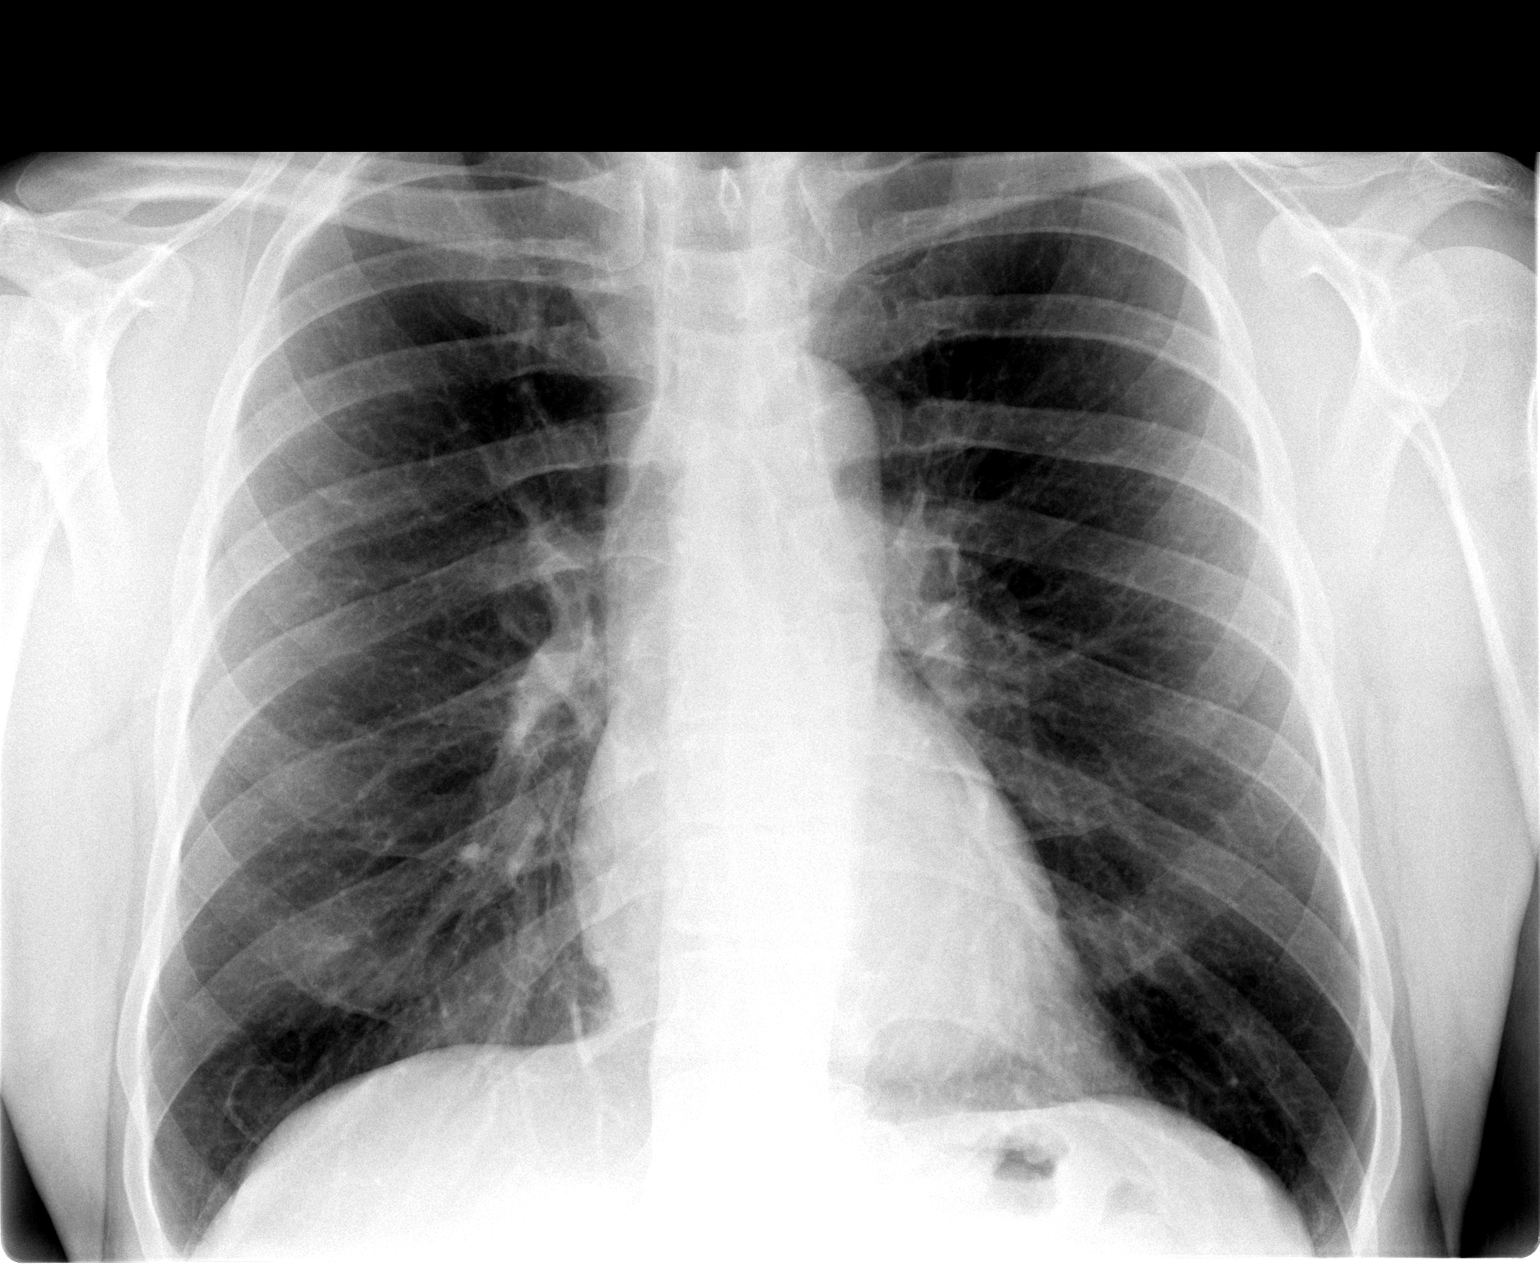

[view not recorded (2 of 3)]
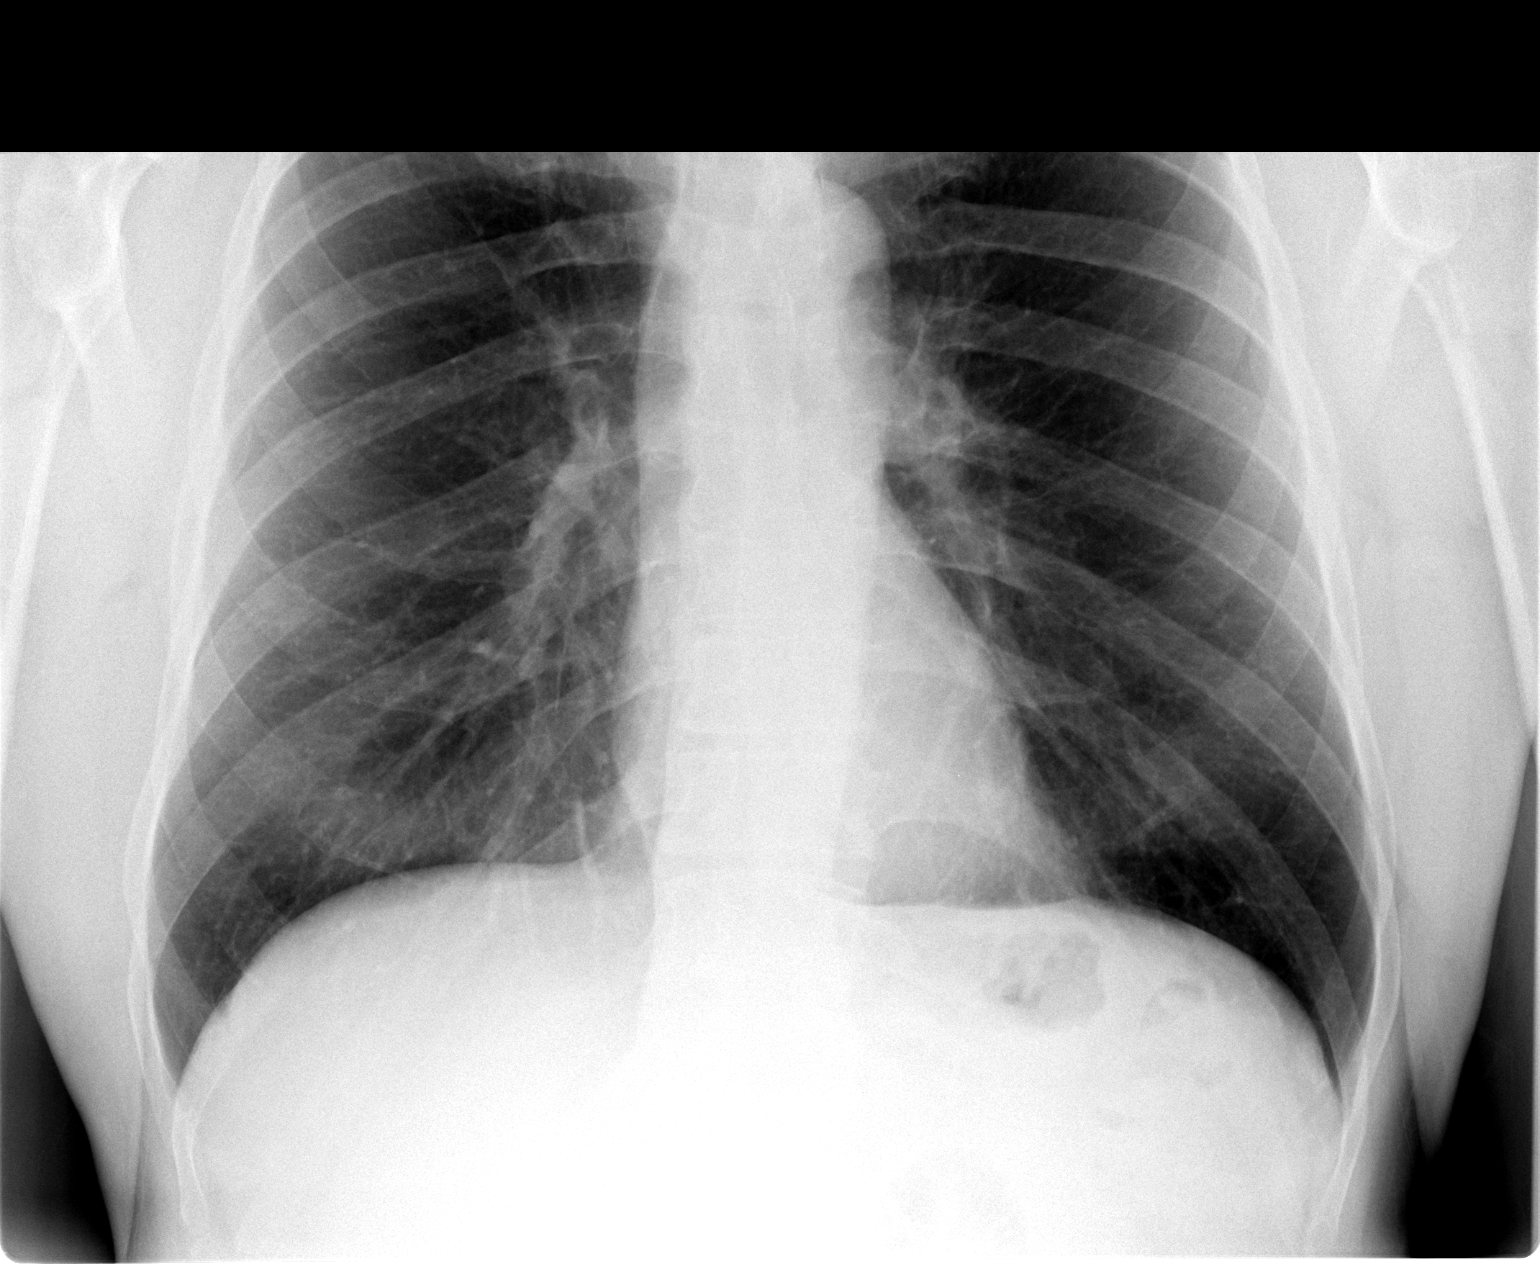

[view not recorded (3 of 3)]
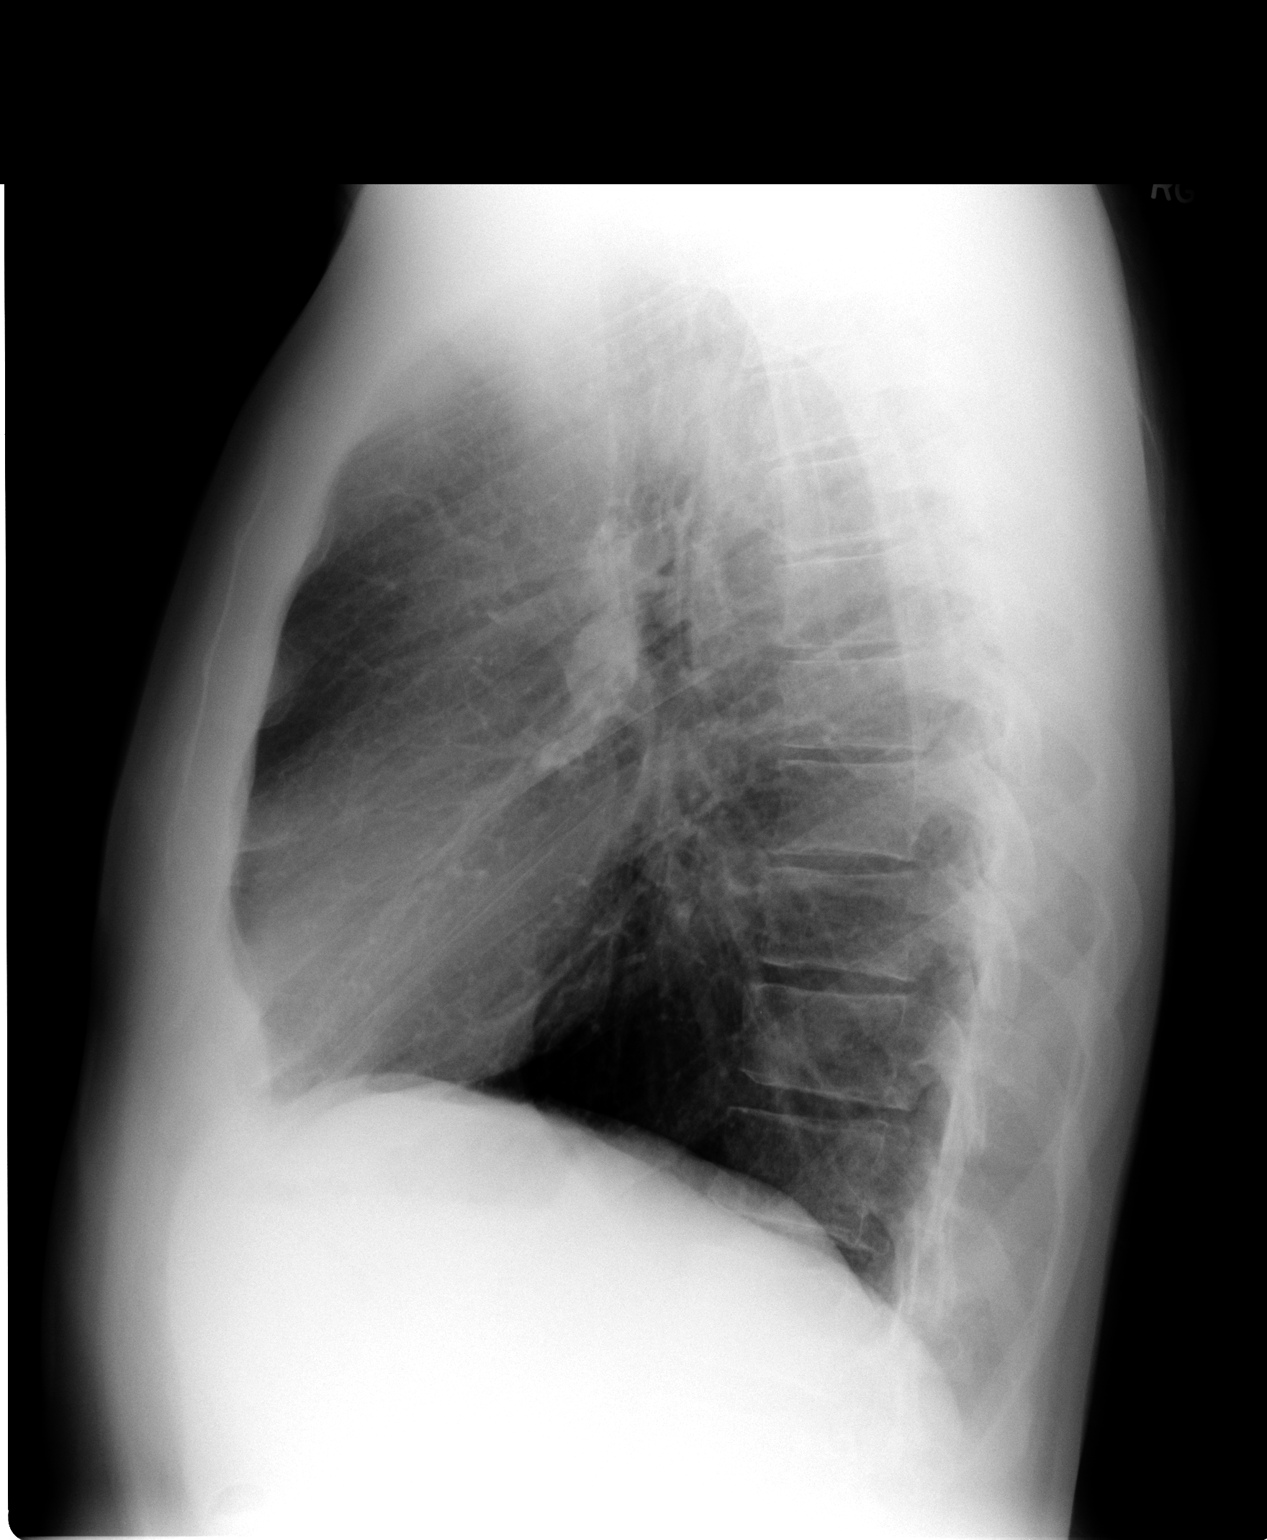

[3 of 3 positions shown; findings below may reference images not displayed]

FINDINGS: The cardiomediastinal silhouette is within normal limits.
Lungs are clear.  No pneumothorax and no pleural effusion.
IMPRESSION: No active cardiopulmonary disease.

## 2013-04-08 ENCOUNTER — Other Ambulatory Visit: Payer: Self-pay | Admitting: *Deleted

## 2013-04-08 MED ORDER — NIACIN ER (ANTIHYPERLIPIDEMIC) 500 MG PO TBCR: 500.0000 mg | EXTENDED_RELEASE_TABLET | Freq: Every day | ORAL | Status: DC

## 2013-04-25 ENCOUNTER — Ambulatory Visit (INDEPENDENT_AMBULATORY_CARE_PROVIDER_SITE_OTHER): Payer: PRIVATE HEALTH INSURANCE | Admitting: Cardiovascular Disease

## 2013-04-25 ENCOUNTER — Encounter: Payer: Self-pay | Admitting: Cardiovascular Disease

## 2013-04-25 VITALS — BP 110/80 | HR 53 | Ht 72.0 in | Wt 188.1 lb

## 2013-04-25 DIAGNOSIS — E785 Hyperlipidemia, unspecified: Secondary | ICD-10-CM

## 2013-04-25 DIAGNOSIS — I252 Old myocardial infarction: Secondary | ICD-10-CM

## 2013-04-25 DIAGNOSIS — I2510 Atherosclerotic heart disease of native coronary artery without angina pectoris: Secondary | ICD-10-CM

## 2013-04-25 NOTE — Progress Notes (Signed)
Patient ID: [name], male   DOB: [birth year], 62 y.o.   MRN: [id number]       HPI: [name], is a 62 y.o. male who presents for six-month cardiology evaluation.  [name] developed an acute coronary syndrome/ST segment elevation inferior wall myocardial infarction on 04/16/2011. At that time, he had never had any cardiac symptoms. Immediately upon arrival to the catheterization laboratory he developed recurrent episodes of ventricular fibrillation and required multiple defibrillations. At catheterization he was found to have total proximal RCA occlusion and a very dominant vessel. He was in cardiogenic shock initially had significant for 1 motion abnormality. Perform successful intervention and ultimately placed a 4.0x22 mm integrity stent postdilated to 4.38 mm and a large proximal right coronary artery. Ejection fraction is ultimately improved to approximately 50-55%. A nuclear perfusion study in October 2013 showed essentially complete salvage of myocardium. He denied [name] for approximately 15 months and ultimately this was discontinued and he has been on aspirin alone. Also has been on combination therapy with low-dose Niaspan Crestor in addition to low-dose beta blocker therapy. He tells me he will be having a complete set of laboratory done several weeks by Dr. Timothy Watkins for his yearly physical. He remains active and exercises 5 days per week. He denies chest pain. He denies palpitations. He denies bleeding.  Past Medical History  Diagnosis Date  . Colon polyps     adenomatous  . Diverticulosis   . Heart attack     Past Surgical History  Procedure Laterality Date  . Coronary stent placement      03/2011    No Known Allergies  Current Outpatient Prescriptions  Medication Sig Dispense Refill  . aspirin 325 MG buffered tablet Take 325 mg by mouth daily.      . cholecalciferol (VITAMIN D) 1000 UNITS tablet Take 1,000 Units by mouth daily.      . metoprolol tartrate (LOPRESSOR) 25  MG tablet Take 25 mg by mouth 1 day or 1 dose. 1/2 tablet twice a day      . Multiple Vitamin (MULTIVITAMIN) capsule Take 1 capsule by mouth daily.      . niacin (NIASPAN) 500 MG CR tablet Take 1 tablet (500 mg total) by mouth at bedtime.  30 tablet  6  . rosuvastatin (CRESTOR) 20 MG tablet Take 20 mg by mouth daily.       No current facility-administered medications for this visit.    History   Social History  . Marital Status: Married    Spouse Name: N/A    Number of Children: 1  . Years of Education: N/A   Occupational History  . Not on file.   Social History Main Topics  . Smoking status: Never Smoker   . Smokeless tobacco: Never Used  . Alcohol Use: No  . Drug Use: No  . Sexual Activity: Not on file   Other Topics Concern  . Not on file   Social History Narrative  . No narrative on file    Family History  Problem Relation Age of Onset  . Cancer Mother     esophagus  . Heart disease Mother   . Cancer Father     spinal     ROS is negative for fevers, chills or night sweats. He denies visual symptoms. He denies skin lesions. Denies bleeding. Denies PND or orthopnea. He denies palpitations. There is no wheezing. He denies chest pain. Denies change in abdominal GI issues. There is no nausea vomiting or diarrhea.  There is no claudication. He denies myalgias. Denies paresthesias. He denies sleep issues. There is no edema  Other comprehensive  12 point ystem review is negative.  PE BP 110/80  Pulse 53  Ht 6' (1.829 m)  Wt 188 lb 1.6 oz (85.322 kg)  BMI 25.51 kg/m2  General: Alert, oriented, no distress.  Skin: normal turgor, no rashes HEENT: Normocephalic, atraumatic. Pupils round and reactive; sclera anicteric;no lid lag.  Nose without nasal septal hypertrophy Mouth/Parynx benign; Mallinpatti scale 2 Neck: No JVD, no carotid briuts Lungs: clear to ausculatation and percussion; no wheezing or rales Heart: RRR, s1 s2 normal  Abdomen: soft, nontender; no  hepatosplenomehaly, BS+; abdominal aorta nontender and not dilated by palpation. Pulses 2+ Extremities: no clubbing cyanosis or edema, Homan's sign negative  Neurologic: grossly nonfocal  ECG: Sinus bradycardia at 53 beats per minute. Normal. Very diminutive nondiagnostic Q waves in II, III, and F with preserved R waves concordant with his myocardial salvage per  LABS:  BMET    Component Value Date/Time   NA 137 04/19/2011 0520   K 3.7 04/19/2011 0520   CL 104 04/19/2011 0520   CO2 25 04/19/2011 0520   GLUCOSE 92 04/19/2011 0520   BUN 8 04/19/2011 0520   CREATININE 0.73 04/19/2011 0520   CALCIUM 9.0 04/19/2011 0520   GFRNONAA >90 04/19/2011 0520   GFRAA >90 04/19/2011 0520     Hepatic Function Panel     Component Value Date/Time   PROT 5.7* 04/16/2011 0700   ALBUMIN 3.2* 04/16/2011 0700   AST 89* 04/16/2011 0700   ALT 18 04/16/2011 0700   ALKPHOS 60 04/16/2011 0700   BILITOT 0.4 04/16/2011 0700     CBC    Component Value Date/Time   WBC 11.1* 04/17/2011 0500   RBC 3.87* 04/17/2011 0500   HGB 12.8* 04/17/2011 0500   HCT 37.1* 04/17/2011 0500   PLT 134* 04/17/2011 0500   MCV 95.9 04/17/2011 0500   MCH 33.1 04/17/2011 0500   MCHC 34.5 04/17/2011 0500   RDW 12.5 04/17/2011 0500     BNP No results found for this basename: probnp    Lipid Panel     Component Value Date/Time   CHOL 165 04/16/2011 0700   TRIG 65 04/16/2011 0700   HDL 49 04/16/2011 0700   CHOLHDL 3.4 04/16/2011 0700   VLDL 13 04/16/2011 0700   LDLCALC 103* 04/16/2011 0700     RADIOLOGY: No results found.    ASSESSMENT AND PLAN: [name] is now 2 years following his acute coronary syndrome/inferior wall ST segment elevation myocardial infarction which was complicated by recurrent episodes of ventricular fibrillation, cardiogenic shock which time he required multiple defibrillations. He has been demonstrated to have essentially complete salvage of myocardium in the RCA territory. He remains asymptomatic. His  other coronary arteries were free of significant disease. He has been on aspirin alone. I suggest he can reduce his dose from 325 mg to just 81 mg. He remains active. He will have a complete set of laboratory done by his primary physician. I will review these when available and if necessary adjustments can be made to his medical regimen. I will see him in 6 months for evaluation, and if he remains stable at that time, will then see him yearly alternating at six-month intervals with Dr. Timothy Watkins.     Lennette Bihari, MD, [facility]  04/25/2013 8:57 AM

## 2013-04-25 NOTE — Patient Instructions (Signed)
Your physician recommends that you schedule a follow-up appointment in: 6 MONTHS. No changes were made today. 

## 2013-05-10 ENCOUNTER — Encounter: Payer: Self-pay | Admitting: Cardiovascular Disease

## 2013-05-18 ENCOUNTER — Other Ambulatory Visit: Payer: Self-pay | Admitting: Cardiovascular Disease

## 2013-05-20 NOTE — Telephone Encounter (Signed)
Rx was sent to pharmacy electronically. 

## 2013-07-10 ENCOUNTER — Encounter: Payer: Self-pay | Admitting: Cardiovascular Disease

## 2013-10-21 ENCOUNTER — Other Ambulatory Visit: Payer: Self-pay | Admitting: *Deleted

## 2013-10-21 MED ORDER — ROSUVASTATIN CALCIUM 20 MG PO TABS
20.0000 mg | ORAL_TABLET | Freq: Every day | ORAL | Status: DC
Start: 2013-10-21 — End: 2014-05-25

## 2013-10-21 NOTE — Telephone Encounter (Signed)
Rx refill sent to pt's pharmacy 

## 2013-10-22 ENCOUNTER — Other Ambulatory Visit: Payer: Self-pay | Admitting: *Deleted

## 2013-10-22 NOTE — Telephone Encounter (Signed)
Rx refill was sent in yesterday to pharmacy

## 2013-11-04 ENCOUNTER — Ambulatory Visit: Payer: PRIVATE HEALTH INSURANCE | Admitting: Cardiovascular Disease

## 2013-11-07 ENCOUNTER — Encounter: Payer: Self-pay | Admitting: Cardiovascular Disease

## 2013-11-07 ENCOUNTER — Ambulatory Visit (INDEPENDENT_AMBULATORY_CARE_PROVIDER_SITE_OTHER): Payer: PRIVATE HEALTH INSURANCE | Admitting: Cardiovascular Disease

## 2013-11-07 VITALS — BP 122/78 | HR 58 | Ht 72.0 in | Wt 188.1 lb

## 2013-11-07 DIAGNOSIS — E785 Hyperlipidemia, unspecified: Secondary | ICD-10-CM

## 2013-11-07 DIAGNOSIS — I252 Old myocardial infarction: Secondary | ICD-10-CM

## 2013-11-07 NOTE — Patient Instructions (Signed)
Your physician recommends that you schedule a follow-up appointment in: ONE YEAR with DR KELLY  

## 2013-11-07 NOTE — Progress Notes (Signed)
Patient ID: Devin LeitzMark Hild, male   DOB: Jul 18, 1951, 63 y.o.   MRN: [id number]        HPI: [name] is a 63 y.o. male who presents for six-month cardiology evaluation.  [name] developed an acute coronary syndrome/ST segment elevation inferior wall myocardial infarction on 04/16/2011. At that time, he had never had any cardiac symptoms. Immediately upon arrival to the catheterization laboratory he developed recurrent episodes of ventricular fibrillation and required multiple defibrillations. At catheterization he was found to have total proximal RCA occlusion and a very dominant vessel. He was in cardiogenic shock initially had significant for wall motion abnormality. I performed successful intervention and ultimately placed a 4.0x22 mm integrity stent postdilated to 4.38 mm in a large proximal right coronary artery. Ejection fraction ultimately improved to approximately 50-55%. A nuclear perfusion study in October 2013 showed essentially complete salvage of myocardium. He was on Brilinta for approximately 15 months and ultimately this was discontinued and he has been on aspirin alone. Also has been on combination therapy with low-dose Niaspan Crestor in addition to low-dose beta blocker therapy.  bleeding.  As I last saw him, he has remained cardiac stable.  He denies recurrent chest pain.  There are no palpitations.  He does admit to increased work related stress.  However, he is managing to exercise 5 days per week.  He tells me Dr. Timothy Lassousso recently complexes a complete set of laboratory.  Was able to obtain laboratory from last year at that time.  His cholesterol was 152, triglycerides 72, HDL 65, and LDL 73.  I will try to obtain his most recent blood work.  Past Medical History  Diagnosis Date  . Colon polyps     adenomatous  . Diverticulosis   . Heart attack     Past Surgical History  Procedure Laterality Date  . Coronary stent placement      03/2011    No Known Allergies  Current  Outpatient Prescriptions  Medication Sig Dispense Refill  . aspirin 81 MG tablet Take 81 mg by mouth daily.      . cholecalciferol (VITAMIN D) 1000 UNITS tablet Take 1,000 Units by mouth daily.      . metoprolol tartrate (LOPRESSOR) 25 MG tablet Take 0.5 tablets (12.5 mg total) by mouth 2 (two) times daily.  30 tablet  6  . Multiple Vitamin (MULTIVITAMIN) capsule Take 1 capsule by mouth daily.      . niacin (NIASPAN) 500 MG CR tablet Take 1 tablet (500 mg total) by mouth at bedtime.  30 tablet  6  . rosuvastatin (CRESTOR) 20 MG tablet Take 1 tablet (20 mg total) by mouth daily.  30 tablet  6   No current facility-administered medications for this visit.    History   Social History  . Marital Status: Married    Spouse Name: N/A    Number of Children: 1  . Years of Education: N/A   Occupational History  . Not on file.   Social History Main Topics  . Smoking status: Never Smoker   . Smokeless tobacco: Never Used  . Alcohol Use: No  . Drug Use: No  . Sexual Activity: Not on file   Other Topics Concern  . Not on file   Social History Narrative  . No narrative on file   Socially, he is married.  He works in Consulting civil engineerT.  He is one child.  He exercises 5 days per week.  There is no tobacco use  Family History  Problem Relation Age of Onset  . Cancer Mother     esophagus  . Heart disease Mother   . Cancer Father     spinal     ROS is negative for fevers, chills or night sweats. He denies visual symptoms. He denies skin lesions.  There is no change in hearing Denies bleeding. Denies PND or orthopnea. He denies palpitations. There is no wheezing. He denies chest pain. Denies change in abdominal GI issues. There is no nausea vomiting or diarrhea.  He denies bleeding from stool or urine.There is no claudication. He denies myalgias. Denies paresthesias. He denies sleep issues. There is no edema  Other comprehensive  14 point ystem review is negative.  PE BP 122/78  Pulse 58  Ht 6'  (1.829 m)  Wt 188 lb 1.6 oz (85.322 kg)  BMI 25.51 kg/m2  General: Alert, oriented, no distress.  Skin: normal turgor, no rashes HEENT: Normocephalic, atraumatic. Pupils round and reactive; sclera anicteric;no lid lag.  Nose without nasal septal hypertrophy Mouth/Parynx benign; Mallinpatti scale 2 Neck: No JVD, no carotid bruits with normal carotid upstroke Lungs: clear to ausculatation and percussion; no wheezing or rales Chest wall: Nontender to palpation Heart: PMI is not displaced.  RRR, s1 s2 normal .  No S3 or S4 gallop.  No diastolic murmurs, rubs, thrills or heaves Abdomen: soft, nontender; no hepatosplenomehaly, BS+; abdominal aorta nontender and not dilated by palpation. Back: No CVA tenderness Pulses 2+ Extremities: no clubbing cyanosis or edema, Homan's sign negative  Neurologic: grossly nonfocal Psychological: Normal affect and mood.  ECG today (Independently read by me): Sinus rhythm at 58 beats per minute.  No significant ST changes.  No evidence for prior myocardial infarction.  Prior 04/25/2013: ECG: Sinus bradycardia at 53 beats per minute. Normal. Very diminutive nondiagnostic Q waves in II, III, and F with preserved R waves concordant with his myocardial salvage per  LABS:  BMET    Component Value Date/Time   NA 137 04/19/2011 0520   K 3.7 04/19/2011 0520   CL 104 04/19/2011 0520   CO2 25 04/19/2011 0520   GLUCOSE 92 04/19/2011 0520   BUN 8 04/19/2011 0520   CREATININE 0.73 04/19/2011 0520   CALCIUM 9.0 04/19/2011 0520   GFRNONAA >90 04/19/2011 0520   GFRAA >90 04/19/2011 0520     Hepatic Function Panel     Component Value Date/Time   PROT 5.7* 04/16/2011 0700   ALBUMIN 3.2* 04/16/2011 0700   AST 89* 04/16/2011 0700   ALT 18 04/16/2011 0700   ALKPHOS 60 04/16/2011 0700   BILITOT 0.4 04/16/2011 0700     CBC    Component Value Date/Time   WBC 11.1* 04/17/2011 0500   RBC 3.87* 04/17/2011 0500   HGB 12.8* 04/17/2011 0500   HCT 37.1* 04/17/2011 0500   PLT 134*  04/17/2011 0500   MCV 95.9 04/17/2011 0500   MCH 33.1 04/17/2011 0500   MCHC 34.5 04/17/2011 0500   RDW 12.5 04/17/2011 0500     BNP No results found for this basename: probnp    Lipid Panel     Component Value Date/Time   CHOL 165 04/16/2011 0700   TRIG 65 04/16/2011 0700   HDL 49 04/16/2011 0700   CHOLHDL 3.4 04/16/2011 0700   VLDL 13 04/16/2011 0700   LDLCALC 103* 04/16/2011 0700     RADIOLOGY: No results found.    ASSESSMENT AND PLAN: [name] is now 2 1/2 years following his acute coronary syndrome/inferior wall  ST segment elevation myocardial infarction which was complicated by recurrent episodes of ventricular fibrillation, cardiogenic shock which time he required multiple defibrillations. He has been demonstrated to have essentially complete salvage of myocardium in the RCA territory. He remains asymptomatic. His other coronary arteries were free of significant disease. He has been on aspirin alone.  Presently, he has been on a medical regimen, consisting of metoprolol tartrate 12.5 mg twice a day and his heart rate remains in the 50s.  He is on aspirin for antiplatelet therapy.  He also is on combination therapy with Crestor and low-dose niacin.  I have suggested that if on recent laboratory upcoming.  He continues to have low triglycerides and very good HDL cholesterol, niacin can be discontinued.  Over, in the past.  He did have an elevated LP a, which was the reason for the niacin treatment.  I discussed with him some of the recent trials, including DAPT trial and Pegasus trial concern potential long-term benefits of dual antiplatelet therapy.  We discussed risks, benefits of treatment.  He continues to be stable and aspirin.  He did not have concomitant CAD and his other vascular territories.  I've asked the next time.  He has blood work done by Dr. Timothy Lassousso that an LP little AP obtained.  As long as he remains stable, I will see him in one year for cardiology evaluation.  Time  spent: 25 minutes  Lennette Biharihomas A. Kelly, MD, [facility] CenterFACC  11/07/2013 7:22 PM

## 2013-11-14 ENCOUNTER — Other Ambulatory Visit: Payer: Self-pay | Admitting: *Deleted

## 2013-11-14 MED ORDER — NIACIN ER (ANTIHYPERLIPIDEMIC) 500 MG PO TBCR: 500.0000 mg | EXTENDED_RELEASE_TABLET | Freq: Every day | ORAL | Status: DC

## 2013-11-14 NOTE — Telephone Encounter (Signed)
Rx refill sent to patient pharmacy   

## 2013-12-04 ENCOUNTER — Other Ambulatory Visit: Payer: Self-pay | Admitting: *Deleted

## 2013-12-04 MED ORDER — METOPROLOL TARTRATE 25 MG PO TABS: 12.5000 mg | ORAL_TABLET | Freq: Two times a day (BID) | ORAL | Status: DC

## 2013-12-04 NOTE — Telephone Encounter (Signed)
Rx was sent to pharmacy electronically. 

## 2013-12-19 ENCOUNTER — Encounter: Payer: Self-pay | Admitting: *Deleted

## 2013-12-25 ENCOUNTER — Encounter (INDEPENDENT_AMBULATORY_CARE_PROVIDER_SITE_OTHER): Payer: Self-pay

## 2013-12-25 ENCOUNTER — Ambulatory Visit (INDEPENDENT_AMBULATORY_CARE_PROVIDER_SITE_OTHER): Payer: PRIVATE HEALTH INSURANCE | Admitting: Neurology

## 2013-12-25 ENCOUNTER — Encounter: Payer: Self-pay | Admitting: Neurology

## 2013-12-25 VITALS — BP 110/71 | HR 59 | Resp 16 | Ht 73.5 in | Wt 191.0 lb

## 2013-12-25 DIAGNOSIS — R0609 Other forms of dyspnea: Secondary | ICD-10-CM

## 2013-12-25 DIAGNOSIS — R0683 Snoring: Secondary | ICD-10-CM

## 2013-12-25 DIAGNOSIS — R0989 Other specified symptoms and signs involving the circulatory and respiratory systems: Secondary | ICD-10-CM

## 2013-12-25 DIAGNOSIS — G4752 REM sleep behavior disorder: Secondary | ICD-10-CM

## 2013-12-25 MED ORDER — CLONAZEPAM 0.5 MG PO TABS: 0.2500 mg | ORAL_TABLET | Freq: Every day | ORAL | Status: DC

## 2013-12-25 NOTE — Progress Notes (Signed)
[facility] SLEEP MEDICINE CONSULT   Provider:  Melvyn Novasarmen  Remonia Otte, M D  Referring Provider: Gwen Poundsusso, [name] M, MD Primary Care Physician:  Gwen PoundsUSSO,[name] M, MD  Chief Complaint  Patient presents with  . New Evaluation    Room 10  . Sleep consult    HPI:  [name] is a 63 y.o. caucasian, married, right handed male , who is seen here as a referral from Dr. Timothy Lassousso for a sleep medicine consult.  [name] reports having vivid nightmares, usually being chased or attacked by an animal. He usually moves in bed- but at least once left the bed and tried to close and block the door. He has banged on the pillow , accidentally hitting his wife, who can display the bruises here.  He reported having these dreams and acting out periods up to 4 times a night, and beginning much more infrequently the year before his heart attack in 2012.  He goes to bed around 11 PM and falls asleep promptly, his bedroom is quiet , dark and cool, he shares the bed with his wife.  By 1.30 or 2 AM he has often the  first display of REM BD . He reported not having vivid nightmares for about 6 month after his heart attack, which could be related to medication changes. He goes to the bathroom 2-3 times , and he has a high fluid intake, not caffeine, no ETOH, never been a smoker. The rise time in the morning is 5.30, he wakes spontaneously. He may sleep until 6 AM on weekends. He does not have a headache, he feels refreshed and restored. He snores, but his wife has not noted any apnea. Snoring is supine the loudest . He has a office with frosted glass window, and gets out of doors for lunch. 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   He has been hyperactive to some degree, always has  nervously moved his legs, tapped his feet.  No falls, no BP variability, no near syncope.         Review of Systems: Out of a complete 14 system review, the patient complains of only the following symptoms, and all  other reviewed systems are negative. Endorses fatigue severity score at 25 times in the for sleepiness score at 5 points , the GDS at 3 points.   History   Social History  . Marital Status: Married    Spouse Name: [name]    Number of Children: 1  . Years of Education: Bachelor's   Occupational History  . Not on file.   Social History Main Topics  . Smoking status: Never Smoker   . Smokeless tobacco: Never Used  . Alcohol Use: Yes     Comment: rarely  . Drug Use: No  . Sexual Activity: Not on file   Other Topics Concern  . Not on file   Social History Narrative   Patient is married ([name]) and lives at home with his wife.   Patient has one adult child.   Patient is working full-time.   Patient has a Bachelor's degree.   Patient is right-handed.   Patient drinks one cup of tea and 2 cups of diet coke daily.    Family History  Problem Relation Age of Onset  . Cancer Mother     esophagus  . Heart disease Mother   . Cancer Father     spinal   . Hypertension Mother     Past Medical  History  Diagnosis Date  . Colon polyps     adenomatous  . Diverticulosis   . Heart attack     Past Surgical History  Procedure Laterality Date  . Coronary stent placement      03/2011  . Broken wrist    . Wisdom tooth extraction      Current Outpatient Prescriptions  Medication Sig Dispense Refill  . aspirin 81 MG tablet Take 81 mg by mouth daily.      . cholecalciferol (VITAMIN D) 1000 UNITS tablet Take 1,000 Units by mouth daily.      . metoprolol tartrate (LOPRESSOR) 25 MG tablet Take 0.5 tablets (12.5 mg total) by mouth 2 (two) times daily.  30 tablet  10  . Multiple Vitamin (MULTIVITAMIN) capsule Take 1 capsule by mouth daily.      . niacin (NIASPAN) 500 MG CR tablet Take 1 tablet (500 mg total) by mouth at bedtime.  30 tablet  6  . rosuvastatin (CRESTOR) 20 MG tablet Take 1 tablet (20 mg total) by mouth daily.  30 tablet  6   No current facility-administered medications  for this visit.    Allergies as of 12/25/2013  . (No Known Allergies)    Vitals: BP 110/71  Pulse 59  Resp 16  Ht 6' 1.5" (1.867 m)  Wt 191 lb (86.637 kg)  BMI 24.86 kg/m2 Last Weight:  Wt Readings from Last 1 Encounters:  12/25/13 191 lb (86.637 kg)   Last Height:   Ht Readings from Last 1 Encounters:  12/25/13 6' 1.5" (1.867 m)    Physical exam:  General: The patient is awake, alert and appears not in acute distress. The patient is well groomed. Head: Normocephalic, atraumatic. Neck is supple. Mallampati 4  neck circumference: 15.5  Inches . He has TMJ click on the left, no delayed swallowing.  Cardiovascular:  Regular rate and rhythm ,  without  murmurs or carotid bruit, and without distended neck veins. Respiratory: Lungs are clear to auscultation. Skin:  Without evidence of edema, or rash Trunk:  Patient has normal posture.  Neurologic exam : The patient is awake and alert, oriented to place and time.  Memory subjective  described as intact. There is a normal attention span & concentration ability.  Speech is fluent without  dysarthria, dysphonia or aphasia. Mood and affect are appropriate.  Cranial nerves: Pupils are equal and briskly reactive to light. Funduscopic exam without  evidence of pallor or edema.   Floaters  In the right lens. Extraocular movements  in vertical and horizontal planes intact and without nystagmus. Visual fields by finger perimetry are intact. Hearing to finger rub intact.  Facial sensation intact to fine touch. Facial motor strength is symmetric and tongue and uvula move midline.  Motor exam:  Normal tone , muscle bulk and symmetric normal strength in all extremities. He has very mild cogwheel rigidity over the left biceps, not the wrist not the shoulder, this could related to a C5 radiculopathy.   Sensory:  Fine touch, pinprick and vibration were tested in all extremities.  Proprioception is tested in the upper extremities only. This was   normal.  Coordination: Rapid alternating movements in the fingers/hands is tested and normal. Finger-to-nose maneuver tested and normal without evidence of ataxia, but there was mild dysmetria,  there was a mild action tremor in the right.  Gait and station: Patient walks without assistive device . Strength within normal limits. Stance is stable and normal.  Tandem gait is intact,  turns with 2 Steps, which  are unfragmented. Romberg testing is normal.  Deep tendon reflexes: in the  upper and lower extremities are symmetric and intact. Babinski maneuver response is  downgoing.   Assessment:  After physical and neurologic examination, review of laboratory studies, imaging, neurophysiology testing and pre-existing records, assessment is   REM BD and snoring, kicking and nightmares.    Plan:  Treatment plan and additional workup : PARASOMNIA MONTAGE PSG ,  Klonopin 0.25 mg nightly.

## 2013-12-30 ENCOUNTER — Telehealth: Payer: Self-pay | Admitting: Neurology

## 2013-12-30 DIAGNOSIS — R0683 Snoring: Secondary | ICD-10-CM

## 2013-12-30 DIAGNOSIS — G4752 REM sleep behavior disorder: Secondary | ICD-10-CM

## 2013-12-30 MED ORDER — CLONAZEPAM 0.5 MG PO TABS: 0.5000 mg | ORAL_TABLET | Freq: Every day | ORAL | Status: DC

## 2013-12-30 NOTE — Telephone Encounter (Signed)
Called pt and pt stated that he is still having REM behaviors and sleeping in a different room, because of these episodes and the the klonopin 0.5 mg is not helping. Pt was last seen on 12/25/13. Pt would like to know what to do at this time. Please advise

## 2013-12-30 NOTE — Telephone Encounter (Signed)
Called pt to inform him per Dr. Vickey Hugerohmeier to increase Klonopin 0.5 mg to two at night and to add melatonin OTC, between 9 and 10 mg nightly ( there are many different OTC does) and to f/u with NP or Dr. Vickey Hugerohmeier and will consider seroquel at next OV. Pt stated that he has an sleep study in 7/15, so he will schedule an appt after that and less he need to come in sooner. I advised the pt that if he has any other problems, questions or concerns to call the office. Pt verbalized understanding.

## 2013-12-30 NOTE — Telephone Encounter (Signed)
We will Increase the KLONOPIN to  0.5 mg  Two at night.  And add melatonin OTC, between 9 and 10 mg nightly ( there a many different OTC doses) .   Next RV with  NP  or me can be moved up to an earlier time.  Will consider seroquel when in office next time.

## 2013-12-30 NOTE — Telephone Encounter (Signed)
Patient called and stated clonazePAM (KLONOPIN) 0.5 MG tablet is not having desired effects.  Still experiencing REM behavior disorder.  Slept in different room due to afraid of harm wife. Questioning if dosage should be increased.  Please call and advise

## 2014-01-30 ENCOUNTER — Ambulatory Visit (INDEPENDENT_AMBULATORY_CARE_PROVIDER_SITE_OTHER): Payer: PRIVATE HEALTH INSURANCE | Admitting: Neurology

## 2014-01-30 DIAGNOSIS — R0683 Snoring: Secondary | ICD-10-CM

## 2014-01-30 DIAGNOSIS — R0989 Other specified symptoms and signs involving the circulatory and respiratory systems: Secondary | ICD-10-CM

## 2014-01-30 DIAGNOSIS — G4752 REM sleep behavior disorder: Secondary | ICD-10-CM

## 2014-01-30 DIAGNOSIS — R0609 Other forms of dyspnea: Secondary | ICD-10-CM

## 2014-01-30 NOTE — Sleep Study (Signed)
P.t states his acting out while dreaming seems to happen more when under more stress. Happened less just after heart attack for unknown reason , he wondered if it could have been a med he was on at the time.

## 2014-02-08 ENCOUNTER — Telehealth: Payer: Self-pay | Admitting: Neurology

## 2014-02-08 NOTE — Telephone Encounter (Signed)
Called pt and discussed the results from his recent sleep study.  Explained to him that the sleep study revealed a dx of REM BD and that a follow up appointment will be scheduled with Dr. Vickey Hugerohmeier to discuss treatment options.  Pt also reports having side effects from Klonopin that will also be addressed during this visit.  A copy of this test was faxed to Dr. Rodrigo RanMark Perini and will be mailed to the patient's home address.

## 2014-02-14 ENCOUNTER — Ambulatory Visit (INDEPENDENT_AMBULATORY_CARE_PROVIDER_SITE_OTHER): Payer: PRIVATE HEALTH INSURANCE | Admitting: Neurology

## 2014-02-14 ENCOUNTER — Encounter: Payer: Self-pay | Admitting: Neurology

## 2014-02-14 VITALS — BP 155/70 | HR 58 | Resp 16 | Ht 73.5 in | Wt 196.0 lb

## 2014-02-14 DIAGNOSIS — R0683 Snoring: Secondary | ICD-10-CM

## 2014-02-14 DIAGNOSIS — R0989 Other specified symptoms and signs involving the circulatory and respiratory systems: Secondary | ICD-10-CM

## 2014-02-14 DIAGNOSIS — G4752 REM sleep behavior disorder: Secondary | ICD-10-CM

## 2014-02-14 DIAGNOSIS — R0609 Other forms of dyspnea: Secondary | ICD-10-CM

## 2014-02-14 HISTORY — DX: REM sleep behavior disorder: G47.52

## 2014-02-14 MED ORDER — CLONAZEPAM 0.5 MG PO TABS: 0.5000 mg | ORAL_TABLET | Freq: Every day | ORAL | Status: DC

## 2014-02-14 NOTE — Progress Notes (Signed)
[facility] SLEEP MEDICINE CONSULT   Provider:  Melvyn Novas, M D  Referring Provider: Gwen Pounds, MD Primary Care Physician:  Gwen Pounds, MD  Chief Complaint  Patient presents with  . Follow-up    Room 10  . Sleep consult    HPI:  [name] is a 63 y.o. caucasian, married, right handed male , who is seen here as a follow up after a sleep study.  He advised me that he has not been diagnosed with Diverticulitis or hypertension as mentioned in his sleep study history .    Study was performed on 01-30-14. It documented a very mild AHI of 6.8 and an RDI of 6.8 supine sleep and REM stage and it accentuates the AHI REM AHI was 11.6 in supine AHI was 14 based on this my main suggestion to reduce snoring or apnea would be to avoid the supine sleep position. The patient had 22 minutes total throughout the night of low oxygen levels the oxygen nadir was 88% which is not significant. He did have some trouble to stay asleep there were multiple episodes of REM behavior disorder nor does 1 was captured on video and was clearly understandable that the patient's poor blood and another episode in more detail and I'm not sure exactly what the words in the hallway or different [place]cox that document of these behaviors. During REM sleep limb movement and shin muscle was still activated confirming the diagnosis of REM behavior disorder. As we have discussed in our previous visit there is a higher overlap of REM behavior disorder leading to Parkinson's disease. The patient had started taking Klonopin and has noticed a significant decrease in the frequency of salsa he is combined at night with melatonin , which  seems to have complimented the Klonopin.        Last visit , after referral from Dr. Timothy Lasso for a sleep medicine consult.  Mr.Geisel reports having vivid nightmares, usually being chased or attacked by an animal. He usually moves in bed- but at least once left the bed and tried  to close and block the door. He has banged on the pillow , accidentally hitting his wife, who can display the bruises here.  He reported having these dreams and acting out periods up to 4 times a night, and beginning much more infrequently the year before his heart attack in 2012.  He goes to bed around 11 PM and falls asleep promptly, his bedroom is quiet , dark and cool, he shares the bed with his wife.  By 1.30 or 2 AM he has often the  first display of REM BD . He reported not having vivid nightmares for about 6 month after his heart attack, which could be related to medication changes. He goes to the bathroom 2-3 times , and he has a high fluid intake, not caffeine, no ETOH, never been a smoker. The rise time in the morning is 5.30, he wakes spontaneously. He may sleep until 6 AM on weekends. He does not have a headache, he feels refreshed and restored. He snores, but his wife has not noted any apnea. Snoring is supine the loudest . He has a office with frosted glass window, and gets out of doors for lunch. 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   He has been hyperactive to some degree, always has  nervously moved his legs, tapped his feet.  No falls, no BP variability, no near syncope.  Review of Systems: Out of a complete 14 system review, the patient complains of only the following symptoms, and all other reviewed systems are negative. Endorses fatigue severity score at 25 times in the for sleepiness score at 5 points , the GDS at 3 points.   History   Social History  . Marital Status: Married    Spouse Name: [name]    Number of Children: 1  . Years of Education: Bachelor's   Occupational History  . Not on file.   Social History Main Topics  . Smoking status: Never Smoker   . Smokeless tobacco: Never Used  . Alcohol Use: Yes     Comment: rarely  . Drug Use: No  . Sexual Activity: Not on file   Other Topics Concern  . Not on file   Social History  Narrative   Patient is married ([name]) and lives at home with his wife.   Patient has one adult child.   Patient is working full-time.   Patient has a Bachelor's degree.   Patient is right-handed.   Patient drinks one cup of tea  daily.    Family History  Problem Relation Age of Onset  . Cancer Mother     esophagus  . Heart disease Mother   . Cancer Father     spinal   . Hypertension Mother     Past Medical History  Diagnosis Date  . Colon polyps     adenomatous  . Diverticulosis   . Heart attack     Past Surgical History  Procedure Laterality Date  . Coronary stent placement      03/2011  . Broken wrist    . Wisdom tooth extraction      Current Outpatient Prescriptions  Medication Sig Dispense Refill  . aspirin 81 MG tablet Take 81 mg by mouth daily.      . cholecalciferol (VITAMIN D) 1000 UNITS tablet Take 1,000 Units by mouth daily.      . clonazePAM (KLONOPIN) 0.5 MG tablet Take 1 tablet (0.5 mg total) by mouth at bedtime.  30 tablet  0  . metoprolol tartrate (LOPRESSOR) 25 MG tablet Take 0.5 tablets (12.5 mg total) by mouth 2 (two) times daily.  30 tablet  10  . Multiple Vitamin (MULTIVITAMIN) capsule Take 1 capsule by mouth daily.      . niacin (NIASPAN) 500 MG CR tablet Take 1 tablet (500 mg total) by mouth at bedtime.  30 tablet  6  . rosuvastatin (CRESTOR) 20 MG tablet Take 1 tablet (20 mg total) by mouth daily.  30 tablet  6   No current facility-administered medications for this visit.    Allergies as of 02/14/2014  . (No Known Allergies)    Vitals: BP 155/70  Pulse 58  Resp 16  Ht 6' 1.5" (1.867 m)  Wt 196 lb (88.905 kg)  BMI 25.51 kg/m2 Last Weight:  Wt Readings from Last 1 Encounters:  02/14/14 196 lb (88.905 kg)   Last Height:   Ht Readings from Last 1 Encounters:  02/14/14 6' 1.5" (1.867 m)    Physical exam:  General: The patient is awake, alert and appears not in acute distress. The patient is well groomed. Head: Normocephalic,  atraumatic. Neck is supple. Mallampati 4  neck circumference: 15.5  Inches . He has TMJ click on the left, no delayed swallowing.  Cardiovascular:  Regular rate and rhythm ,  without  murmurs or carotid bruit, and without distended neck veins. Respiratory: Lungs are  clear to auscultation. Skin:  Without evidence of edema, or rash Trunk:  Patient has normal posture.  Neurologic exam : The patient is awake and alert, oriented to place and time.  Memory subjective  described as intact. There is a normal attention span & concentration ability.  Speech is fluent without  dysarthria, dysphonia or aphasia. Mood and affect are appropriate.  Cranial nerves: Pupils are equal and briskly reactive to light. Funduscopic exam without  evidence of pallor or edema.   Floaters  In the right lens. Extraocular movements  in vertical and horizontal planes intact and without nystagmus. Visual fields by finger perimetry are intact. Hearing to finger rub intact.  Facial sensation intact to fine touch. Facial motor strength is symmetric and tongue and uvula move midline.  Motor exam:  Normal tone , muscle bulk and symmetric normal strength in all extremities. He has very mild cogwheel rigidity over the left biceps, not the wrist not the shoulder, this could related to a C5 radiculopathy.   Sensory:  Fine touch, pinprick and vibration were tested in all extremities.  Proprioception is tested in the upper extremities only. This was  normal.  Coordination: Rapid alternating movements in the fingers/hands is tested and normal. Finger-to-nose maneuver tested and normal without evidence of ataxia, but there was mild dysmetria,  there was a mild action tremor in the right. Gait and station: Patient walks without assistive device . Strength within normal limits. Stance is stable and normal.  Tandem gait is intact, turns with 2 Steps, which  are unfragmented. Romberg testing is normal. Deep tendon reflexes: in the  upper and  lower extremities are symmetric and intact. Babinski maneuver response is  downgoing.   Assessment:  After physical and neurologic examination, review of laboratory studies, imaging, neurophysiology testing and pre-existing records, assessment is   REM BD and snoring, kicking and nightmares.  Confirmed REM BD in sleep study, Yearly RV to [facility] and PD signs and symptoms.    Plan:  Klonopin 0.25 mg nightly.

## 2014-02-20 NOTE — Telephone Encounter (Signed)
Noted  

## 2014-05-25 ENCOUNTER — Other Ambulatory Visit: Payer: Self-pay | Admitting: Cardiovascular Disease

## 2014-05-26 NOTE — Telephone Encounter (Signed)
E sent to pharmacy 

## 2014-05-29 ENCOUNTER — Encounter: Payer: Self-pay | Admitting: Cardiovascular Disease

## 2014-05-30 ENCOUNTER — Encounter: Payer: Self-pay | Admitting: Cardiovascular Disease

## 2014-06-09 ENCOUNTER — Other Ambulatory Visit: Payer: Self-pay

## 2014-06-09 MED ORDER — NIACIN ER (ANTIHYPERLIPIDEMIC) 500 MG PO TBCR: 500.0000 mg | EXTENDED_RELEASE_TABLET | Freq: Every day | ORAL | Status: DC

## 2014-06-09 NOTE — Telephone Encounter (Signed)
Rx sent to pharmacy   

## 2014-08-17 ENCOUNTER — Other Ambulatory Visit: Payer: Self-pay | Admitting: Neurology

## 2014-08-19 NOTE — Telephone Encounter (Signed)
Rx signed and faxed.

## 2014-10-03 ENCOUNTER — Other Ambulatory Visit: Payer: Self-pay | Admitting: Cardiovascular Disease

## 2014-11-24 ENCOUNTER — Encounter: Payer: Self-pay | Admitting: Cardiovascular Disease

## 2014-11-24 ENCOUNTER — Ambulatory Visit (INDEPENDENT_AMBULATORY_CARE_PROVIDER_SITE_OTHER): Payer: PRIVATE HEALTH INSURANCE | Admitting: Cardiovascular Disease

## 2014-11-24 ENCOUNTER — Other Ambulatory Visit: Payer: Self-pay | Admitting: Cardiovascular Disease

## 2014-11-24 VITALS — BP 112/80 | HR 49 | Ht 72.0 in | Wt 192.5 lb

## 2014-11-24 DIAGNOSIS — E785 Hyperlipidemia, unspecified: Secondary | ICD-10-CM | POA: Diagnosis not present

## 2014-11-24 DIAGNOSIS — R001 Bradycardia, unspecified: Secondary | ICD-10-CM | POA: Diagnosis not present

## 2014-11-24 DIAGNOSIS — I252 Old myocardial infarction: Secondary | ICD-10-CM

## 2014-11-24 DIAGNOSIS — G4752 REM sleep behavior disorder: Secondary | ICD-10-CM

## 2014-11-24 MED ORDER — CLOPIDOGREL BISULFATE 75 MG PO TABS: 75.0000 mg | ORAL_TABLET | Freq: Every day | ORAL | Status: DC

## 2014-11-24 NOTE — Telephone Encounter (Signed)
Rx(s) sent to pharmacy electronically.  

## 2014-11-24 NOTE — Patient Instructions (Signed)
Your physician has recommended you make the following change in your medication: start new prescription for clopidogrel. This has already been sent to your pharmacy.   Your physician has requested that you have an exercise stress myoview and office visit in 1 year or sooner if needed.

## 2014-11-25 ENCOUNTER — Encounter: Payer: Self-pay | Admitting: Cardiovascular Disease

## 2014-11-25 DIAGNOSIS — R001 Bradycardia, unspecified: Secondary | ICD-10-CM | POA: Insufficient documentation

## 2014-11-25 NOTE — Progress Notes (Signed)
Patient ID: [name], male   DOB: [birth year], 64 y.o.   MRN: [id number]        HPI: [name] is a 64 y.o. male who presents for a one year cardiology evaluation.  [name] developed an acute coronary syndrome/ST segment elevation inferior wall myocardial infarction on 04/16/2011. At that time, he had never had any cardiac symptoms. Immediately upon arrival to the catheterization laboratory he developed recurrent episodes of ventricular fibrillation and required multiple defibrillations. At catheterization he was found to have total proximal RCA occlusion in a very dominant vessel. He was in cardiogenic shock initially had significant for wall motion abnormality. I performed successful intervention and ultimately placed a 4.0x22 mm integrity stent postdilated to 4.38 mm in a large proximal right coronary artery. Ejection fraction ultimately improved to approximately 50-55%. A nuclear perfusion study in October 2013 showed essentially complete salvage of myocardium. He was on Brilinta for approximately 15 months and ultimately this was discontinued and he has been on aspirin alone. Also has been on combination therapy with low-dose Niaspan Crestor in addition to low-dose beta blocker therapy.  Since I last saw him he has remained cardiac stable.  He denies recurrent chest pain.  There are no palpitations.  He does admit to increased work related stress.  However, he is managing to exercise 5 days per week.  He sees Dr. Virgina Jock, for primary care.  He also was diagnosed as having insomnia with a REM sleep behavior disorder associated with  vivid frightful dreams, thrashing with viscious animals.  He has seen Dr. Roddie Mc and was started on Klonopin with some improvement in his symptomatology.     Past Medical History  Diagnosis Date  . Colon polyps     adenomatous  . Diverticulosis   . Heart attack   . RBD (REM behavioral disorder) 02/14/2014    Past Surgical History  Procedure Laterality Date   . Coronary stent placement      03/2011  . Broken wrist    . Wisdom tooth extraction      No Known Allergies  Current Outpatient Prescriptions  Medication Sig Dispense Refill  . aspirin 81 MG tablet Take 81 mg by mouth daily.    . cholecalciferol (VITAMIN D) 1000 UNITS tablet Take 1,000 Units by mouth daily.    . clonazePAM (KLONOPIN) 0.5 MG tablet TAKE ONE TABLET BY MOUTH AT BEDTIME 30 tablet 5  . CRESTOR 20 MG tablet TAKE 1 TABLET (20 MG TOTAL) BY MOUTH DAILY. 30 tablet 6  . Melatonin 5 MG TABS Take 2 tablets by mouth at bedtime.    . Multiple Vitamin (MULTIVITAMIN) capsule Take 1 capsule by mouth daily.    . niacin (NIASPAN) 500 MG CR tablet Take 1 tablet (500 mg total) by mouth at bedtime. 30 tablet 6  . clopidogrel (PLAVIX) 75 MG tablet Take 1 tablet (75 mg total) by mouth daily. 30 tablet 6  . metoprolol tartrate (LOPRESSOR) 25 MG tablet Take 0.5 tablets (12.5 mg total) by mouth 2 (two) times daily. 30 tablet 11   No current facility-administered medications for this visit.    History   Social History  . Marital Status: Married    Spouse Name: [name]  . Number of Children: 1  . Years of Education: Bachelor's   Occupational History  . Not on file.   Social History Main Topics  . Smoking status: Never Smoker   . Smokeless tobacco: Never Used  . Alcohol Use: 0.0 oz/week  0 Standard drinks or equivalent per week     Comment: rarely  . Drug Use: No  . Sexual Activity: Not on file   Other Topics Concern  . Not on file   Social History Narrative   Patient is married ([name]) and lives at home with his wife.   Patient has one adult child.   Patient is working full-time.   Patient has a Bachelor's degree.   Patient is right-handed.   Patient drinks one cup of tea  daily.   Socially, he is married.  He works in Engineer, technical sales.  He is one child.  He exercises 5 days per week.  There is no tobacco use  Family History  Problem Relation Age of Onset  . Cancer Mother      esophagus  . Heart disease Mother   . Cancer Father     spinal   . Hypertension Mother    ROS General: Negative; No fevers, chills, or night sweats;  HEENT: Negative; No changes in vision or hearing, sinus congestion, difficulty swallowing Pulmonary: Negative; No cough, wheezing, shortness of breath, hemoptysis Cardiovascular: Negative; No chest pain, presyncope, syncope, palpitations GI: Negative; No nausea, vomiting, diarrhea, or abdominal pain GU: Negative; No dysuria, hematuria, or difficulty voiding Musculoskeletal: Negative; no myalgias, joint pain, or weakness Hematologic/Oncology: Negative; no easy bruising, bleeding Endocrine: Negative; no heat/cold intolerance; no diabetes Neuro: Negative; no changes in balance, headaches Skin: Negative; No rashes or skin lesions Psychiatric: Negative; No behavioral problems, depression Sleep: Positive for them sleep behavior disorder/parasomnia; No snoring, daytime sleepiness, hypersomnolence, bruxism, restless legs, hypnogognic hallucinations, no cataplexy Other comprehensive 14 point system review is negative.   PE BP 112/80 mmHg  Pulse 49  Ht 6' (1.829 m)  Wt 192 lb 8 oz (87.317 kg)  BMI 26.10 kg/m2  General: Alert, oriented, no distress.  Skin: normal turgor, no rashes HEENT: Normocephalic, atraumatic. Pupils round and reactive; sclera anicteric;no lid lag.  Nose without nasal septal hypertrophy Mouth/Parynx benign; Mallinpatti scale 2 Neck: No JVD, no carotid bruits with normal carotid upstroke Lungs: clear to ausculatation and percussion; no wheezing or rales Chest wall: Nontender to palpation Heart: PMI is not displaced.  RRR, s1 s2 normal .  No S3 or S4 gallop.  No diastolic murmurs, rubs, thrills or heaves Abdomen: soft, nontender; no hepatosplenomehaly, BS+; abdominal aorta nontender and not dilated by palpation. Back: No CVA tenderness Pulses 2+ Extremities: no clubbing cyanosis or edema, Homan's sign negative    Neurologic: grossly nonfocal Psychological: Normal affect and mood.  ECG (independently read by me): Sinus bradycardia 49 bpm.  No ectopy.  Normal intervals.  No ST segment changes.  April 2015ECG today (Independently read by me): Sinus rhythm at 58 beats per minute.  No significant ST changes.  No evidence for prior myocardial infarction.  Prior 04/25/2013: ECG: Sinus bradycardia at 53 beats per minute. Normal. Very diminutive nondiagnostic Q waves in II, III, and F with preserved R waves concordant with his myocardial salvage per  LABS: BMP Latest Ref Rng 04/19/2011 04/17/2011 04/16/2011  Glucose 70 - 99 mg/dL 92 104(H) 140(H)  BUN 6 - 23 mg/dL _0 Creatinine 0.50 - 1.35 mg/dL 0.73 0.69 0.73  Sodium 135 - 145 mEq/L 137 135 136  Potassium 3.5 - 5.1 mEq/L 3.7 3.7 4.4  Chloride 96 - 112 mEq/L 104 105 107  CO2 19 - 32 mEq/L _1 Calcium 8.4 - 10.5 mg/dL 9.0 8.3(L) 7.9(L)   Hepatic Function Latest Ref  Rng 04/16/2011 04/16/2011  Total Protein 6.0 - 8.3 g/dL 5.7(L) 6.1  Albumin 3.5 - 5.2 g/dL 3.2(L) 3.6  AST 0 - 37 U/L 89(H) 14  ALT 0 - 53 U/L 18 11  Alk Phosphatase 39 - 117 U/L 60 64  Total Bilirubin 0.3 - 1.2 mg/dL 0.4 0.4   CBC Latest Ref Rng 04/17/2011 04/16/2011 04/16/2011  WBC 4.0 - 10.5 K/uL 11.1(H) 13.2(H) -  Hemoglobin 13.0 - 17.0 g/dL 12.8(L) 13.1 13.9  Hematocrit 39.0 - 52.0 % 37.1(L) 36.7(L) 41.0  Platelets 150 - 400 K/uL 134(L) 162 -   No results found for: HGBA1C  Lab Results  Component Value Date   TSH 0.566 04/16/2011   Lipid Panel     Component Value Date/Time   CHOL 165 04/16/2011 0700   TRIG 65 04/16/2011 0700   HDL 49 04/16/2011 0700   CHOLHDL 3.4 04/16/2011 0700   VLDL 13 04/16/2011 0700   LDLCALC 103* 04/16/2011 0700      RADIOLOGY: No results found.    ASSESSMENT AND PLAN: [name] is a 64 year old male who is  3 1/2 years following his acute coronary syndrome/inferior wall ST segment elevation myocardial infarction which was  complicated by recurrent episodes of ventricular fibrillation, cardiogenic shock which  required multiple defibrillations. He has been demonstrated to have essentially complete salvage of myocardium in the RCA territory. He remains asymptomatic. His other coronary arteries were free of significant disease.   Presently, he has been on a medical regimen, consisting of metoprolol tartrate 12.5 mg twice a day and his heart rate remains bradycardic. Marland Kitchen  He had been on dual antiplatelet therapy with aspirin and Brilinta for 15 months.  Most recently, he has just been on aspirin alone.  I have discussed with him both the Pegasus trial as well as the DAPP trial.  With his life-threatening event years ago, and with the benefit of recent data, I have recommended resumption of dual antiplatelet therapy and will start clopidogrel 75 mg daily to take with his baby aspirin.  He has a history of vivid, frightful dreams and is felt to have a REM sleep behavior disorder which has improved with clonazepam and he also takes melatonin for improved sleep.  Continues to be on aggressive lipid-lowering therapy for mixed hyperlipidemia and continues to take Crestor 20 mg in addition to low-dose niacin 500 mg.  His last nuclear perfusion study was in 2013.  I will see him in one year for further evaluation and prior to that office visit.  I am recommending he undergo a four-year follow-up nuclear perfusion study.    Time spent: 25 minutes  Troy Sine, MD, [facility]  11/25/2014 7:39 PM

## 2014-12-08 ENCOUNTER — Other Ambulatory Visit: Payer: Self-pay | Admitting: Cardiovascular Disease

## 2014-12-08 ENCOUNTER — Encounter: Payer: Self-pay | Admitting: Cardiovascular Disease

## 2014-12-08 NOTE — Telephone Encounter (Signed)
Rx has been sent to the pharmacy electronically. ° °

## 2014-12-18 ENCOUNTER — Encounter: Payer: Self-pay | Admitting: Cardiovascular Disease

## 2014-12-24 ENCOUNTER — Telehealth: Payer: Self-pay | Admitting: Cardiovascular Disease

## 2014-12-24 NOTE — Telephone Encounter (Signed)
Clarified w/ patient, restated that Plavix should not cause these symptoms. However, he does state that after starting on Plavix, he noted BPs 86-90/60, symptomatic for lightheadedness, near syncope, etc. He had no other recent med chgs.  Since stopping plavix he has had improvement and felt better, BP systolic 110s. Not currently experiencing problems.  Notes concern also that HR has been low, typically mid 50s.  States he can resume Plavix but wanted advice on what to do w/ other meds - informed him i would defer to Dr. Tresa EndoKelly to see if other med chgs OK to address BP, so that he can resume the Plavix.

## 2014-12-24 NOTE — Telephone Encounter (Signed)
Please see pt emails regarding the Plavix.

## 2014-12-24 NOTE — Telephone Encounter (Signed)
Pt said he send 2 e mails through my chart,still have not gotten a response. The generic Plavix caused his blood pressure to drop real low,also he was dizzy. Pt wants you to know he stopped taking this about 2 weeks ago.In his my- chart,it was noted that he has Bradycardia,but Dr Tresa EndoKelly did not mention this. Is this something he should be concerned about?

## 2014-12-24 NOTE — Telephone Encounter (Signed)
Plavix should not cause hypotension; probably not related to it. ? Is BP low due to other meds

## 2014-12-25 NOTE — Telephone Encounter (Signed)
Discussed with Baxter Hire; will dc plavix

## 2014-12-26 NOTE — Telephone Encounter (Signed)
Discussed Dr. Landry Dyke advice with patient, he voiced understanding & thanks - will d/c Plavix.

## 2015-01-03 ENCOUNTER — Other Ambulatory Visit: Payer: Self-pay | Admitting: Cardiovascular Disease

## 2015-01-05 NOTE — Telephone Encounter (Signed)
Rx(s) sent to pharmacy electronically.  

## 2015-02-04 ENCOUNTER — Encounter: Payer: Self-pay | Admitting: Internal Medicine

## 2015-02-17 ENCOUNTER — Encounter: Payer: Self-pay | Admitting: Neurology

## 2015-02-17 ENCOUNTER — Ambulatory Visit (INDEPENDENT_AMBULATORY_CARE_PROVIDER_SITE_OTHER): Payer: PRIVATE HEALTH INSURANCE | Admitting: Neurology

## 2015-02-17 VITALS — BP 110/62 | HR 60 | Resp 20 | Ht 72.0 in | Wt 193.0 lb

## 2015-02-17 DIAGNOSIS — F518 Other sleep disorders not due to a substance or known physiological condition: Secondary | ICD-10-CM

## 2015-02-17 DIAGNOSIS — G4752 REM sleep behavior disorder: Secondary | ICD-10-CM

## 2015-02-17 MED ORDER — CLONAZEPAM 0.5 MG PO TABS: 0.5000 mg | ORAL_TABLET | Freq: Every day | ORAL | Status: DC

## 2015-02-17 NOTE — Progress Notes (Signed)
[facility] SLEEP MEDICINE CONSULT   Provider:  Melvyn Novas, M D  Referring Provider: Creola Corn, MD Primary Care Physician:  Gwen Pounds, MD  Chief Complaint  Patient presents with  . Follow-up    REM sleep behavior d/o, rm 11, with wife    HPI:  [name] is a 64 y.o. caucasian, married, right handed male , who is seen here as a follow up after a sleep study.  He advised me that he has not been diagnosed with Diverticulitis or hypertension as mentioned in his sleep study history . Sleep sStudy was performed on 01-30-14. It documented a very mild AHI of 6.8 and an RDI of 6.8 supine sleep and REM stage and it accentuates the AHI REM AHI was 11.6 in supine AHI was 14 based on this my main suggestion to reduce snoring or apnea would be to avoid the supine sleep position. The patient had 22 minutes total throughout the night of low oxygen levels the oxygen nadir was 88% which is not significant. He did have some trouble to stay asleep there were multiple episodes of REM behavior disorder nor does 1 was captured on video and was clearly understandable that the patient's poor blood and another episode in more detail,  that document of these behaviors. During REM sleep limb movement and his  muscle was still activated confirming the diagnosis of REM behavior disorder.  As we have discussed in our previous visit there is a higher overlap of REM behavior disorder leading to Parkinson's disease. The patient had started taking Klonopin and has noticed a significant decrease in the frequency of salsa he is combined at night with melatonin , which  seems to have complimented the Klonopin. Mr.Kreh reports having vivid nightmares, usually being chased or attacked by an animal. He usually moves in bed- but at least once left the bed and tried to close and block the door. He has banged on the pillow , accidentally hitting his wife, who can display the bruises here.  He reported having  these dreams and acting out periods up to 4 times a night, and beginning much more infrequently the year before his heart attack in 2012.  He goes to bed around 11 PM and falls asleep promptly, his bedroom is quiet , dark and cool, he shares the bed with his wife.  By 1.30 or 2 AM he has often the  first display of REM BD . He reported not having vivid nightmares for about 6 month after his heart attack, which could be related to medication changes. He goes to the bathroom 2-3 times , and he has a high fluid intake, not caffeine, no ETOH, never been a smoker. The rise time in the morning is 5.30, he wakes spontaneously. He may sleep until 6 AM on weekends. He does not have a headache, he feels refreshed and restored. He snores, but his wife has not noted any apnea. Snoring is supine the loudest . He has a office with frosted glass window, and gets out of doors for lunch. 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   He has been hyperactive to some degree, always has  nervously moved his legs, tapped his feet.  No falls, no BP variability, no near syncope.   Interval history from 02-17-15. This is a yearly revisit, patient [name] last seen in July 2015, patent of Dr Timothy Lasso.  Mr. Rhoads is followed here for REM behavior disorder we are  requesting him to fill sleep related questionnaires as well as memory or cognitive tests. He endorsed the geriatric depression score at 2 points which is clinically deemed insignificant the Epworth sleepiness score at 7 which is a normal range and the fatigue severity score at 20 points also in normal range. He did excellent with the Montral cognitive assessment test. I prefer to apply this test over her Mini-Mental Status Examination due to the patient's age and level of education. The patient was well able to pass the trail making test, copied a three-dimensional cube image and to the clock face. He was also able to name 3 animals do the serial 7 subtraction the  language obstruction and fluency test and he recalled 3 out of 5 recall words as well as being fully oriented to date time and place. The word fluency was 14 words beginning with the letter F. His score was 28 points out of a possible 30 and she lost 2 points to delayed recall. This is not indicative of significant short-term memory loss.  She reports no tremors no quivering voice, no falls or gait instability. No cogwheeling is noted. He also does not suffer from restless legs.  The patient reports that his REM behavior disorder has not affected him significantly over the last 12 months he did have a bout of several nights about 4 months ago. However this has not recurred since and he states that it was several months before that about that he had any REM behavior that his wife had witnessed. He remains of course  unawareof the disorder. He sometimes mumbles.  No hitting and no thrashing. He reports that he cannot recall his visit dreams that he used to have before.        Review of Systems: Out of a complete 14 system review, the patient complains of only the following symptoms, and all other reviewed systems are negative. See above .   History   Social History  . Marital Status: Married    Spouse Name: Camelia Eng  . Number of Children: 1  . Years of Education: Bachelor's   Occupational History  . Not on file.   Social History Main Topics  . Smoking status: Never Smoker   . Smokeless tobacco: Never Used  . Alcohol Use: 0.0 oz/week    0 Standard drinks or equivalent per week     Comment: rarely  . Drug Use: No  . Sexual Activity: Not on file   Other Topics Concern  . Not on file   Social History Narrative   Patient is married ([name]) and lives at home with his wife.   Patient has one adult child.   Patient is working full-time.   Patient has a Bachelor's degree.   Patient is right-handed.   Patient drinks one cup of tea  daily.    Family History  Problem Relation Age of  Onset  . Cancer Mother     esophagus  . Heart disease Mother   . Cancer Father     spinal   . Hypertension Mother     Past Medical History  Diagnosis Date  . Colon polyps     adenomatous  . Diverticulosis   . Heart attack   . RBD (REM behavioral disorder) 02/14/2014    Past Surgical History  Procedure Laterality Date  . Coronary stent placement      03/2011  . Broken wrist    . Wisdom tooth extraction      Current Outpatient  Prescriptions  Medication Sig Dispense Refill  . aspirin 81 MG tablet Take 81 mg by mouth daily.    . cholecalciferol (VITAMIN D) 1000 UNITS tablet Take 1,000 Units by mouth daily.    . clonazePAM (KLONOPIN) 0.5 MG tablet TAKE ONE TABLET BY MOUTH AT BEDTIME 30 tablet 5  . CRESTOR 20 MG tablet TAKE 1 TABLET (20 MG TOTAL) BY MOUTH DAILY. 30 tablet 10  . Melatonin 5 MG TABS Take 2 tablets by mouth at bedtime.    . metoprolol tartrate (LOPRESSOR) 25 MG tablet Take 0.5 tablets (12.5 mg total) by mouth 2 (two) times daily. 30 tablet 11  . Multiple Vitamin (MULTIVITAMIN) capsule Take 1 capsule by mouth daily.    . naproxen (NAPROSYN) 500 MG tablet     . niacin (NIASPAN) 500 MG CR tablet TAKE 1 TABLET (500 MG TOTAL) BY MOUTH AT BEDTIME. 30 tablet 10   No current facility-administered medications for this visit.    Allergies as of 02/17/2015  . (No Known Allergies)    Vitals: BP 110/62 mmHg  Pulse 60  Resp 20  Ht 6' (1.829 m)  Wt 193 lb (87.544 kg)  BMI 26.17 kg/m2 Last Weight:  Wt Readings from Last 1 Encounters:  02/17/15 193 lb (87.544 kg)   Last Height:   Ht Readings from Last 1 Encounters:  02/17/15 6' (1.829 m)    Physical exam:  General: The patient is awake, alert and appears not in acute distress. The patient is well groomed. Head: Normocephalic, atraumatic. Neck is supple. Mallampati 4  neck circumference: 15.5  Inches . He has TMJ click on the left, no delayed swallowing.  Cardiovascular:  Regular rate and rhythm ,  without   murmurs or carotid bruit, and without distended neck veins. Respiratory: Lungs are clear to auscultation. Skin:  Without evidence of edema, or rash Trunk:  Patient has normal posture.  Neurologic exam : The patient is awake and alert, oriented to place and time.  Memory : performed MOCA with patient , who lost 2 of 5 words in recall.  There is a normal attention span & concentration ability.  Speech is fluent without  dysarthria, dysphonia or aphasia. Mood and affect are appropriate.  Cranial nerves: Pupils are equal and briskly reactive to light. Funduscopic exam without  evidence of pallor or edema.   Floaters  In the right lens. Extraocular movements  in vertical and horizontal planes intact and without nystagmus. Visual fields by finger perimetry are intact. Hearing to finger rub intact.  Facial sensation intact to fine touch. Facial motor strength is symmetric and tongue and uvula move midline.  Motor exam:  Normal tone , muscle bulk and symmetric normal strength in all extremities.  He has no longer cogwheel rigidity over the left biceps, not the wrist not the shoulder, this could related to a C5 radiculopathy.   Sensory:  Fine touch, pinprick and vibration were tested in all extremities. No deficits.  Coordination: Rapid alternating movements in the fingers/hands is tested and normal.  Finger-to-nose maneuver tested and normal without evidence of ataxia, but there was mild dysmetria,  there was a mild action tremor in the right. Gait and station: Patient walks without assistive device, steady and with normal armswing.  Strength within normal limits. Stance is stable and normal.  Tandem gait is intact, turns with 2 Steps, which  are unfragmented. Romberg testing is normal. Deep tendon reflexes: in the  upper and lower extremities are symmetric and intact.   Assessment:  After physical and neurologic examination, review of laboratory studies, imaging, neurophysiology testing and  pre-existing records, assessment is   REM BD and snoring, kicking and nightmares.  Confirmed REM BD in sleep study,    Plan:  Klonopin 0.25 mg nightly. Continue medication, yearly RV for REM BD with MOCA.

## 2015-03-02 ENCOUNTER — Encounter: Payer: Self-pay | Admitting: Nurse Practitioner

## 2015-03-25 ENCOUNTER — Ambulatory Visit (INDEPENDENT_AMBULATORY_CARE_PROVIDER_SITE_OTHER): Payer: Self-pay | Admitting: Nurse Practitioner

## 2015-03-25 ENCOUNTER — Encounter: Payer: Self-pay | Admitting: Nurse Practitioner

## 2015-03-25 VITALS — BP 118/70 | HR 64 | Ht 72.0 in | Wt 187.5 lb

## 2015-03-25 DIAGNOSIS — Z86010 Personal history of colonic polyps: Secondary | ICD-10-CM

## 2015-03-25 DIAGNOSIS — Z1211 Encounter for screening for malignant neoplasm of colon: Secondary | ICD-10-CM

## 2015-03-25 MED ORDER — NA SULFATE-K SULFATE-MG SULF 17.5-3.13-1.6 GM/177ML PO SOLN: 1.0000 | Freq: Once | ORAL | Status: AC

## 2015-03-25 NOTE — Patient Instructions (Addendum)
You have been scheduled for a colonoscopy. Please follow written instructions given to you at your visit today.  Please pick up your prep supplies at the pharmacy within the next 1-3 days. [facility] [place], [place]. If you use inhalers (even only as needed), please bring them with you on the day of your procedure. Your physician has requested that you go to www.startemmi.com and enter the access code given to you at your visit today. This web site gives a general overview about your procedure. However, you should still follow specific instructions given to you by our office regarding your preparation for the procedure.  Stop at our front desk on the way out today and get a refund for the co-pay you paid today.

## 2015-03-25 NOTE — Progress Notes (Signed)
HPI :   Patient is a 64 year old male known to Dr. Marina Goodell. He had a small adenomatous ascending colon polyp removed on last colonoscopy September 2011 and is due for surveillance exam now. No gastrointestinal complaints. Specifically, no bowel changes, blood in stool, abdominal pain or weight loss  This appointment was inadvertently made by our office. Staff thought patient was on antiplatelets therapy based on cardiology's last office note May 2016. Patient does have a history of coronary artery disease, he had a myocardial infarction with cardiogenic shock in 2012. Patient underwent stent placement and was treated with antiplatelets medication for 15 months. At the time of last cardiology visit in May of this year, Dr. Tresa Endo recommended resumption of dual antiplatelets therapy with Plavix and aspirin given patient's life-threatening event in 2012. Patient tells me that he took Plavix for a couple of weeks but that it was ultimately discontinued due to dizziness/hypotension.   Past Medical History  Diagnosis Date  . Colon polyps     adenomatous  . Diverticulosis   . Heart attack   . RBD (REM behavioral disorder) 02/14/2014    Family History  Problem Relation Age of Onset  . Cancer Mother     esophagus  . Heart disease Mother   . Cancer Father     spinal   . Hypertension Mother    Social History  Substance Use Topics  . Smoking status: Never Smoker   . Smokeless tobacco: Never Used  . Alcohol Use: 0.0 oz/week    0 Standard drinks or equivalent per week     Comment: rarely   Current Outpatient Prescriptions  Medication Sig Dispense Refill  . aspirin 81 MG tablet Take 81 mg by mouth daily.    . cholecalciferol (VITAMIN D) 1000 UNITS tablet Take 1,000 Units by mouth daily.    . clonazePAM (KLONOPIN) 0.5 MG tablet Take 1 tablet (0.5 mg total) by mouth at bedtime. 90 tablet 3  . CRESTOR 20 MG tablet TAKE 1 TABLET (20 MG TOTAL) BY MOUTH DAILY. 30 tablet 10  . Melatonin 5 MG TABS  Take 2 tablets by mouth at bedtime.    . metoprolol tartrate (LOPRESSOR) 25 MG tablet Take 0.5 tablets (12.5 mg total) by mouth 2 (two) times daily. 30 tablet 11  . Multiple Vitamin (MULTIVITAMIN) capsule Take 1 capsule by mouth daily.    . niacin (NIASPAN) 500 MG CR tablet TAKE 1 TABLET (500 MG TOTAL) BY MOUTH AT BEDTIME. 30 tablet 10   No current facility-administered medications for this visit.   No Known Allergies   Review of Systems: All systems reviewed and negative except where noted in HPI.   Physical Exam: BP 118/70 mmHg  Pulse 64  Ht 6' (1.829 m)  Wt 187 lb 8 oz (85.049 kg)  BMI 25.42 kg/m2 Constitutional: Pleasant,well-developed, white male in no acute distress. HEENT: Normocephalic and atraumatic. Conjunctivae are normal. No scleral icterus. Neck supple.  Cardiovascular: Normal rate, regular rhythm.  Pulmonary/chest: Effort normal and breath sounds normal. No wheezing, rales or rhonchi. Abdominal: Soft, nondistended, nontender. Bowel sounds active throughout. There are no masses palpable. No hepatomegaly. Extremities: no edema Lymphadenopathy: No cervical adenopathy noted. Neurological: Alert and oriented to person place and time. Skin: Skin is warm and dry. No rashes noted. Psychiatric: Normal mood and affect. Behavior is normal.   ASSESSMENT AND PLAN:  69. 64 year old male with history of adenomatous colon polyps due for surveillance colonoscopy.The risks, benefits, and alternatives to colonoscopy with possible biopsy and  possible polypectomy were discussed with the patient and he consents to proceed.   2. History of myocardial infarction with cardiogenic shock 2012. Patient is status post stent placement and 15 months of antiplatelets therapy. Now on daily ASA alone.  Marland Kitchen

## 2015-03-26 DIAGNOSIS — Z86010 Personal history of colonic polyps: Secondary | ICD-10-CM | POA: Insufficient documentation

## 2015-03-26 NOTE — Progress Notes (Signed)
Agree with assessment and plans 

## 2015-05-18 ENCOUNTER — Telehealth: Payer: Self-pay | Admitting: Cardiovascular Disease

## 2015-05-18 DIAGNOSIS — M791 Myalgia, unspecified site: Secondary | ICD-10-CM

## 2015-05-18 DIAGNOSIS — Z79899 Other long term (current) drug therapy: Secondary | ICD-10-CM

## 2015-05-18 NOTE — Telephone Encounter (Signed)
check CPK, ESR

## 2015-05-18 NOTE — Telephone Encounter (Signed)
Pt called in stating that he has been having some severe muscle lately and he went to his pharmacist , who advised him that the generic Crestor could possibly be the cause and to consult his Cardiologist. Please f/u with pt  Thanks

## 2015-05-18 NOTE — Telephone Encounter (Signed)
Spoke with patient who reports the following:  End of May, switched from name brand crestor to generic.  Over the summer, got more and more severe hip pain - into knee, foot would go numb.  Went to orthopaedist - did physical therapy with no relief.  Pain with walking got more severe - couldn't stand for 10 minutes Is scheduled for MRI tomorrow ordered by ortho.  He spoke with his pharmacist who told him that the medication can cause deterioration of his muscle.  Last dose of crestor Saturday evening.  Sees PCP in 2 weeks - will have lab work.   Routed to Dr. Tresa EndoKelly to review/advise - any labs patient needs to have done?  Patient will hold crestor until notified otherwise.

## 2015-05-19 NOTE — Telephone Encounter (Signed)
Patient notified that MD has ordered labs. He would like to get these done with his lab work for his PCP on 11/14. Labs ordered and mailed to patient.

## 2015-05-22 ENCOUNTER — Encounter: Payer: Self-pay | Admitting: Internal Medicine

## 2015-06-02 ENCOUNTER — Ambulatory Visit (AMBULATORY_SURGERY_CENTER): Payer: PRIVATE HEALTH INSURANCE | Admitting: Internal Medicine

## 2015-06-02 ENCOUNTER — Encounter: Payer: Self-pay | Admitting: Internal Medicine

## 2015-06-02 VITALS — BP 122/76 | HR 56 | Temp 96.0°F | Resp 30 | Ht 72.0 in | Wt 187.0 lb

## 2015-06-02 DIAGNOSIS — Z86010 Personal history of colonic polyps: Secondary | ICD-10-CM | POA: Diagnosis present

## 2015-06-02 MED ORDER — SODIUM CHLORIDE 0.9 % IV SOLN: 500.0000 mL | INTRAVENOUS | Status: DC

## 2015-06-02 NOTE — Op Note (Signed)
[facility] [address] [place], [id number]   COLONOSCOPY PROCEDURE REPORT  PATIENT: [name], Sheriff  MR#: [id number] BIRTHDATE: 07-09-1951 , 64  yrs. old GENDER: male ENDOSCOPIST: Roxy CedarJohn N Perry Jr, MD REFERRED OZ:[id number]:Surveillance Program Recall PROCEDURE DATE:  06/02/2015 PROCEDURE:   Colonoscopy, surveillance First Screening Colonoscopy - Avg.  risk and is 50 yrs.  old or older - No.  Prior Negative Screening - Now for repeat screening. N/A  History of Adenoma - Now for follow-up colonoscopy & has been > or = to 3 yrs.  Yes hx of adenoma.  Has been 3 or more years since last colonoscopy.  Polyps removed today? No Recommend repeat exam, <10 yrs? No ASA CLASS:   Class II INDICATIONS:Surveillance due to prior colonic neoplasia and PH Colon Adenoma. . Index exam 2006 with small polyp (no pathology); follow-up September 2011 with small adenoma. MEDICATIONS: Monitored anesthesia care and Propofol 200 mg IV  DESCRIPTION OF PROCEDURE:   After the risks benefits and alternatives of the procedure were thoroughly explained, informed consent was obtained.  The digital rectal exam revealed no abnormalities of the rectum.   The LB [id number]  endoscope was introduced through the anus and advanced to the cecum, which was identified by both the appendix and ileocecal valve. No adverse events experienced.   The quality of the prep was excellent. (Suprep was used)  The instrument was then slowly withdrawn as the colon was fully examined. Estimated blood loss is zero unless otherwise noted in this procedure report.      COLON FINDINGS: There was mild diverticulosis noted in the left colon.   The examination was otherwise normal. No polyps identified.  Retroflexed views revealed internal hemorrhoids. The time to cecum = 3.4 Withdrawal time = 10.8   The scope was withdrawn and the procedure completed. COMPLICATIONS: There were no immediate complications.  ENDOSCOPIC  IMPRESSION: 1.   Mild diverticulosis was noted in the left colon 2.   The examination was otherwise normal  RECOMMENDATIONS: 1. Continue current colorectal surveillance recommendations with a repeat colonoscopy in 10 years.  eSigned:  Roxy CedarJohn N Perry Jr, MD 06/02/2015 8:35 AM   cc: The Patient and Creola CornJohn Russo, MD

## 2015-06-02 NOTE — Progress Notes (Signed)
Report to PACU, RN, vss, BBS= Clear.  

## 2015-06-02 NOTE — Patient Instructions (Signed)
YOU HAD AN ENDOSCOPIC PROCEDURE TODAY AT THE [facility]:   Refer to the procedure report that was given to you for any specific questions about what was found during the examination.  If the procedure report does not answer your questions, please call your gastroenterologist to clarify.  If you requested that your care partner not be given the details of your procedure findings, then the procedure report has been included in a sealed envelope for you to review at your convenience later.  YOU SHOULD EXPECT: Some feelings of bloating in the abdomen. Passage of more gas than usual.  Walking can help get rid of the air that was put into your GI tract during the procedure and reduce the bloating. If you had a lower endoscopy (such as a colonoscopy or flexible sigmoidoscopy) you may notice spotting of blood in your stool or on the toilet paper. If you underwent a bowel prep for your procedure, you may not have a normal bowel movement for a few days.  Please Note:  You might notice some irritation and congestion in your nose or some drainage.  This is from the oxygen used during your procedure.  There is no need for concern and it should clear up in a day or so.  SYMPTOMS TO REPORT IMMEDIATELY:   Following lower endoscopy (colonoscopy or flexible sigmoidoscopy):  Excessive amounts of blood in the stool  Significant tenderness or worsening of abdominal pains  Swelling of the abdomen that is new, acute  Fever of 100F or higher    For urgent or emergent issues, a gastroenterologist can be reached at any hour by calling [phone].   DIET: Your first meal following the procedure should be a small meal and then it is ok to progress to your normal diet. Heavy or fried foods are harder to digest and may make you feel nauseous or bloated.  Likewise, meals heavy in dairy and vegetables can increase bloating.  Drink plenty of fluids but you should avoid alcoholic beverages for 24  hours.  ACTIVITY:  You should plan to take it easy for the rest of today and you should NOT DRIVE or use heavy machinery until tomorrow (because of the sedation medicines used during the test).    FOLLOW UP: Our staff will call the number listed on your records the next business day following your procedure to check on you and address any questions or concerns that you may have regarding the information given to you following your procedure. If we do not reach you, we will leave a message.  However, if you are feeling well and you are not experiencing any problems, there is no need to return our call.  We will assume that you have returned to your regular daily activities without incident.  If any biopsies were taken you will be contacted by phone or by letter within the next 1-3 weeks.  Please call us at [phone] if you have not heard about the biopsies in 3 weeks.    SIGNATURES/CONFIDENTIALITY: You and/or your care partner have signed paperwork which will be entered into your electronic medical record.  These signatures attest to the fact that that the information above on your After Visit Summary has been reviewed and is understood.  Full responsibility of the confidentiality of this discharge information lies with you and/or your care-partner.   Information on diverticulosis given to you today 

## 2015-06-03 ENCOUNTER — Telehealth: Payer: Self-pay | Admitting: *Deleted

## 2015-06-03 NOTE — Telephone Encounter (Signed)
  Follow up Call-  Call back number 06/02/2015  Post procedure Call Back phone  # [phone]  Permission to leave phone message Yes     Patient questions:  Do you have a fever, pain , or abdominal swelling? No. Pain Score  0 *  Have you tolerated food without any problems? Yes.    Have you been able to return to your normal activities? Yes.    Do you have any questions about your discharge instructions: Diet   No. Medications  No. Follow up visit  No.  Do you have questions or concerns about your Care? No.  Actions: * If pain score is 4 or above: No action needed, pain <4.

## 2015-06-19 ENCOUNTER — Encounter: Payer: Self-pay | Admitting: Internal Medicine

## 2015-08-23 ENCOUNTER — Other Ambulatory Visit: Payer: Self-pay | Admitting: Neurology

## 2015-08-24 NOTE — Telephone Encounter (Signed)
Its a tab,can break in half. CD

## 2015-08-24 NOTE — Telephone Encounter (Signed)
Your note says that the pt should be taking klonopin 0.25 mg nightly, but pt was ordered klonopin 0.5 mg nightly. Which do you prefer? If if is the klonopin 0.25 mg, please change the RX. (it is a controlled substance.)  Per your note: "Plan: Klonopin 0.25 mg nightly. Continue medication, yearly RV for REM BD with MOCA."

## 2015-08-24 NOTE — Telephone Encounter (Signed)
Your note says that the pt should be taking klonopin 0.25 mg nightly, but was ordered klonopin 0.5 mg nightly. Which do you prefer? If if is the klonopin 0.25 mg, please change the RX. (it is a controlled substance.)  Per your note: "Plan: Klonopin 0.25 mg nightly. Continue medication, yearly RV for REM BD with MOCA."

## 2015-08-26 MED ORDER — CLONAZEPAM 0.5 MG PO TABS: 0.5000 mg | ORAL_TABLET | Freq: Every day | ORAL | Status: DC

## 2015-08-26 NOTE — Telephone Encounter (Addendum)
Pt called and says that he has been taking Klonopin 0.5 mg and his rx was changed to taking 0.25 mg. He is concerned that if he takes his normal dose of 0.5 mg that he will run out before he will be able to get a refill. Can a new Rx be sent to pharm to correct the rx to say 0.5mg  once nightly? May call pt [phone]

## 2015-08-26 NOTE — Telephone Encounter (Signed)
I spoke to pt and advised him that Dr. Vickey Huger approved the RX request for klonopin 0.5 mg qhs. Pt verbalized understanding. [facility] and alerted [facility] that the RX changed to klonopin 0.5mg  qhs. They gave him 45 tablets of the 0.5mg  yesterday, so he will be able to pick up the new RX after 45 days.

## 2015-08-26 NOTE — Addendum Note (Signed)
Addended by: Geronimo Running A on: 08/26/2015 08:29 AM   Modules accepted: Orders

## 2015-11-01 ENCOUNTER — Other Ambulatory Visit: Payer: Self-pay | Admitting: Cardiovascular Disease

## 2015-11-02 NOTE — Telephone Encounter (Signed)
Rx(s) sent to pharmacy electronically.  

## 2015-11-05 ENCOUNTER — Ambulatory Visit (INDEPENDENT_AMBULATORY_CARE_PROVIDER_SITE_OTHER): Payer: PRIVATE HEALTH INSURANCE | Admitting: Nurse Practitioner

## 2015-11-05 ENCOUNTER — Encounter: Payer: Self-pay | Admitting: Nurse Practitioner

## 2015-11-05 VITALS — BP 125/74 | HR 66 | Ht 72.0 in | Wt 192.8 lb

## 2015-11-05 DIAGNOSIS — E785 Hyperlipidemia, unspecified: Secondary | ICD-10-CM | POA: Diagnosis not present

## 2015-11-05 DIAGNOSIS — M79604 Pain in right leg: Secondary | ICD-10-CM | POA: Diagnosis not present

## 2015-11-05 DIAGNOSIS — I2510 Atherosclerotic heart disease of native coronary artery without angina pectoris: Secondary | ICD-10-CM

## 2015-11-05 DIAGNOSIS — I119 Hypertensive heart disease without heart failure: Secondary | ICD-10-CM | POA: Diagnosis not present

## 2015-11-05 NOTE — Patient Instructions (Signed)
Medication Instructions: HOLD Crestor for 2 more weeks.  >If pain gets better, resume Crestor  >If pain gets worse, please call the office  Labwork: NONE  Testing/Procedures: 1. ABIs - Your physician has requested that you have an ankle brachial index (ABI). During this test an ultrasound and blood pressure cuff are used to evaluate the arteries that supply the arms and legs with blood. Allow thirty minutes for this exam. There are no restrictions or special instructions.  2. Please schedule an exercise myoview - this has already been ordered.  Follow-up: Ward Givenshris Berge, NP, recommends that you schedule a follow-up appointment in 6 months with Dr Tresa EndoKelly. You will receive a reminder letter in the mail two months in advance. If you don't receive a letter, please call our office to schedule the follow-up appointment.  If you need a refill on your cardiac medications before your next appointment, please call your pharmacy.

## 2015-11-05 NOTE — Progress Notes (Signed)
Office Visit    Patient Name: [name] Date of Encounter: 11/05/2015  Primary Care Provider:  Gwen Pounds, MD Primary Cardiologist:  Bishop Limbo, MD   Chief Complaint    65 year old male with a history of CAD status post inferior MI and RCA stenting in 2012 who presents for annual follow-up.  Past Medical History    Past Medical History  Diagnosis Date  . Colon polyps     adenomatous  . Diverticulosis   . RBD (REM behavioral disorder) 02/14/2014  . Hyperlipidemia   . Hypertensive heart disease   . CAD (coronary artery disease)     a. Inf MI/PCI: 100% pRCA (4.0x22 Integrity BMS->dil to 4.32mm)-->MI complicated by VF and CGS;  b. 04/2012 MV: EF 65%, inf attenuation, no ischemia.   Past Surgical History  Procedure Laterality Date  . Coronary stent placement      03/2011  . Broken wrist    . Wisdom tooth extraction    . Colonoscopy      Allergies  No Known Allergies  History of Present Illness    65 year old male with the above past medical history. He is status post inferior myocardial infarction in September 2012, which was complicated by ventricular fibrillation and cardiogenic shock. He underwent successful PCI and stenting of the occluded right coronary artery at that time. Follow-up stress testing in 2013 showed normal LV function without evidence of ischemia. He was last seen in clinic 1 year ago. Following that clinic visit, he notes that his insurance, we switched him from name brand Crestor to generic rosuvastatin. Shortly thereafter, he began to experience right hip and leg pain occurring exclusively with ambulation and relieved with rest. He underwent orthopedic evaluation with question of possible sciatic nerve irritation. He subsequently was seen by neurosurgery at [facility] and this was not felt to be significant. He was on gabapentin at one point but discontinued this after it did not improve his symptoms. He also underwent hip injections without significant  improvement. Last fall, he questioned whether or not his statin may be causing his symptoms. He did hold it for a period of 2 weeks and isn't sure that he saw any significant change in his symptoms. He also had labs drawn by his primary care provider and reportedly his CK and LFTs were within normal limits. He then resumed his rosuvastatin and continued to note right hip and leg pain with activity. About a week ago, he stopped his rosuvastatin to see if symptoms improved. He followed up with [facility] yesterday and had his hip injected. Following that, he was able to walk for longer period of time than usual. He previously used to walk 3 minutes on a treadmill daily but more recently has only been able to walk short periods without resting. In all of this, he has not had any chest pain or dyspnea. Further, he has not noticed any change in hair distribution, coloration, or sensation of his right leg.  Home Medications    Prior to Admission medications   Medication Sig Start Date End Date Taking? Authorizing Provider  aspirin 81 MG tablet Take 81 mg by mouth daily.    Historical Provider, MD  cholecalciferol (VITAMIN D) 1000 UNITS tablet Take 1,000 Units by mouth daily.    Historical Provider, MD  clonazePAM (KLONOPIN) 0.5 MG tablet Take 1 tablet (0.5 mg total) by mouth at bedtime. 08/26/15   Porfirio Mylar Dohmeier, MD  Melatonin 5 MG TABS Take 2 tablets by mouth at  bedtime.    Historical Provider, MD  metoprolol tartrate (LOPRESSOR) 25 MG tablet Take 0.5 tablets (12.5 mg total) by mouth 2 (two) times daily. 11/24/14   Lennette Bihari, MD  niacin (NIASPAN) 500 MG CR tablet Take 1 tablet (500 mg total) by mouth at bedtime. <PLEASE MAKE APPOINTMENT FOR REFILLS> 11/02/15   Lennette Bihari, MD    Review of Systems    As above, he has been having right hip and leg pain with ambulation since last summer.  He denies chest pain, palpitations, dyspnea, pnd, orthopnea, n, v, dizziness, syncope, edema, weight gain, or early  satiety.   All other systems reviewed and are otherwise negative except as noted above.  Physical Exam    VS:  BP 125/74 mmHg  Pulse 66  Ht 6' (1.829 m)  Wt 192 lb 12.8 oz (87.454 kg)  BMI 26.14 kg/m2 , BMI Body mass index is 26.14 kg/(m^2). GEN: Well nourished, well developed, in no acute distress. HEENT: normal. Neck: Supple, no JVD, carotid bruits, or masses. Cardiac: RRR, no murmurs, rubs, or gallops. No clubbing, cyanosis, edema.  Radials 2+/DP/PT 1+ and equal bilaterally.  Respiratory:  Respirations regular and unlabored, clear to auscultation bilaterally. GI: Soft, nontender, nondistended, BS + x 4. MS: no deformity or atrophy. Skin: warm and dry, no rash. Neuro:  Strength and sensation are intact. Psych: Normal affect.  Accessory Clinical Findings    ECG - Regular sinus rhythm, 61, prior inferior infarct, no acute ST or T changes.  Assessment & Plan    1.  Coronary artery disease: Status post prior inferior ST segment elevation myocardial infarction with bare metal stenting of the right coronary artery. He has done well from a cardiac standpoint without chest pain or dyspnea. He has had difficulty with ambulation secondary to right hip and leg pain which he is unsure as to whether or not this may be related to taking generic rosuvastatin. He remains on aspirin and beta blocker therapy but currently, his rosuvastatin is on hold. As outlined last year, when he saw Dr. Tresa Endo, he is scheduled for a repeat Myoview to assess for ischemia as it has been 4 years since his last one.  2. Right hip and leg pain: This first started after switching to generic rosuvastatin. He did have labs last fall which reportedly showed normal CK and LFTs. These were drawn by his primary care provider. He had previously taken a statin holiday for about 2 weeks last fall without significant improvement. He has also been extensively evaluated by orthopedics and neurosurgery. He held his statin again about a  week ago and then yesterday underwent hip injection. He did note some relief after the hip injection and he now questions which intervention has contributed to that relief. Of note, his his comfort only occurs with exertion. Symptoms resolved with rest. He has 1+ distal pulses. There is no evidence of skin breakdown, discoloration, or change in hair distribution. Given his history of coronary artery disease, I will obtain arterial brachial indices to rule out peripheral vascular disease as a potential cause of his symptoms.  3. Hyperlipidemia: As above, he is currently holding his statin. I think that is reasonable in order to see if it is contributing to his right hip and leg pain. I advised that he should hold it for 2 additional weeks and that if his symptoms resolve, he should resume his rosuvastatin at the previously prescribed dose. If symptoms recur, he should notify us and we would  switch him to atorvastatin.  4. Hypertensive heart disease: Blood pressure is stable on beta blocker therapy.  5. Disposition: Obtain arterial brachial indices in the setting of right hip and leg pain with ambulation. I will arrange for follow-up with Dr. Tresa EndoKelly in 6 months or sooner if necessary.   Nicolasa Duckinghristopher Mabell Esguerra, NP 11/05/2015, 8:43 AM

## 2015-11-10 ENCOUNTER — Other Ambulatory Visit: Payer: Self-pay | Admitting: Cardiovascular Disease

## 2015-11-10 NOTE — Telephone Encounter (Signed)
Rx(s) sent to pharmacy electronically.  

## 2015-11-18 ENCOUNTER — Telehealth: Payer: Self-pay | Admitting: Cardiovascular Disease

## 2015-11-18 NOTE — Telephone Encounter (Signed)
New Message  Pt requested to speak w./ RN about which (if any) meds (maybe metoprolol) need to be held before upcoming nuc/stress test. Please call back and discuss.

## 2015-11-18 NOTE — Telephone Encounter (Signed)
Spoke with pt, aware he will take his beta blocker prior to the nuclear stress test.

## 2015-11-19 ENCOUNTER — Telehealth (HOSPITAL_COMMUNITY): Payer: Self-pay

## 2015-11-19 NOTE — Telephone Encounter (Signed)
Encounter complete. 

## 2015-11-24 ENCOUNTER — Other Ambulatory Visit: Payer: Self-pay | Admitting: Nurse Practitioner

## 2015-11-24 ENCOUNTER — Encounter (HOSPITAL_COMMUNITY): Payer: Self-pay

## 2015-11-24 ENCOUNTER — Ambulatory Visit (HOSPITAL_COMMUNITY)
Admission: RE | Admit: 2015-11-24 | Discharge: 2015-11-24 | Disposition: A | Discharge status: Home / Self Care | Payer: PRIVATE HEALTH INSURANCE | Source: Ambulatory Visit | Attending: Cardiovascular Disease | Admitting: Cardiovascular Disease

## 2015-11-24 DIAGNOSIS — R0989 Other specified symptoms and signs involving the circulatory and respiratory systems: Secondary | ICD-10-CM

## 2015-11-24 DIAGNOSIS — I252 Old myocardial infarction: Secondary | ICD-10-CM | POA: Diagnosis not present

## 2015-11-24 DIAGNOSIS — I739 Peripheral vascular disease, unspecified: Secondary | ICD-10-CM | POA: Diagnosis not present

## 2015-11-24 DIAGNOSIS — M79604 Pain in right leg: Secondary | ICD-10-CM | POA: Insufficient documentation

## 2015-11-24 DIAGNOSIS — I10 Essential (primary) hypertension: Secondary | ICD-10-CM | POA: Insufficient documentation

## 2015-11-24 DIAGNOSIS — Z8249 Family history of ischemic heart disease and other diseases of the circulatory system: Secondary | ICD-10-CM | POA: Insufficient documentation

## 2015-11-24 LAB — MYOCARDIAL PERFUSION IMAGING
CHL CUP NUCLEAR SDS: 4
CSEPPHR: 81 {beats}/min
LV dias vol: 129 mL (ref 62–150)
LVSYSVOL: 65 mL
Rest HR: 49 {beats}/min
SRS: 0
SSS: 4
TID: 1.17

## 2015-11-24 MED ORDER — REGADENOSON 0.4 MG/5ML IV SOLN
0.4000 mg | Freq: Once | INTRAVENOUS | Status: AC
  Administered 2015-11-24: 0.4 mg via INTRAVENOUS

## 2015-11-24 MED ORDER — TECHNETIUM TC 99M SESTAMIBI GENERIC - CARDIOLITE
10.1000 | Freq: Once | INTRAVENOUS | Status: AC | PRN
  Administered 2015-11-24: 10.1 via INTRAVENOUS

## 2015-11-24 MED ORDER — TECHNETIUM TC 99M SESTAMIBI GENERIC - CARDIOLITE
32.0000 | Freq: Once | INTRAVENOUS | Status: AC | PRN
  Administered 2015-11-24: 32 via INTRAVENOUS

## 2015-11-30 ENCOUNTER — Other Ambulatory Visit: Payer: Self-pay | Admitting: Cardiovascular Disease

## 2015-11-30 NOTE — Telephone Encounter (Signed)
Rx request sent to pharmacy.  

## 2015-12-05 ENCOUNTER — Other Ambulatory Visit: Payer: Self-pay | Admitting: Cardiovascular Disease

## 2015-12-07 ENCOUNTER — Encounter: Payer: Self-pay | Admitting: Nurse Practitioner

## 2015-12-07 NOTE — Telephone Encounter (Signed)
Rx(s) sent to pharmacy electronically.  

## 2015-12-23 ENCOUNTER — Encounter: Payer: Self-pay | Admitting: Nurse Practitioner

## 2015-12-23 ENCOUNTER — Encounter: Payer: Self-pay | Admitting: Neurology

## 2015-12-25 NOTE — Telephone Encounter (Signed)
I spoke to pt and made an appt with him for 6/12 at 2:30 to discuss medications and symptoms, per a mychart conversation with Dr. Vickey Hugerohmeier. Pt is agreeable to coming in on 6/12 at 2:30.

## 2015-12-28 ENCOUNTER — Telehealth: Payer: Self-pay

## 2015-12-28 ENCOUNTER — Ambulatory Visit: Payer: Self-pay | Admitting: Neurology

## 2015-12-28 NOTE — Telephone Encounter (Signed)
I called [name] to advise him that his appt for today needs to be cancelled because Dr. Vickey Hugerohmeier had to leave sick. [name] is agreeable to coming in on 01/05/16 at 9:30. [name] verbalized understanding of appt.

## 2016-01-05 ENCOUNTER — Encounter: Payer: Self-pay | Admitting: Neurology

## 2016-01-05 ENCOUNTER — Ambulatory Visit (INDEPENDENT_AMBULATORY_CARE_PROVIDER_SITE_OTHER): Payer: BLUE CROSS/BLUE SHIELD | Admitting: Neurology

## 2016-01-05 VITALS — BP 112/76 | HR 62 | Resp 20 | Ht 72.0 in | Wt 187.0 lb

## 2016-01-05 DIAGNOSIS — F518 Other sleep disorders not due to a substance or known physiological condition: Secondary | ICD-10-CM

## 2016-01-05 DIAGNOSIS — G478 Other sleep disorders: Secondary | ICD-10-CM

## 2016-01-05 DIAGNOSIS — G4752 REM sleep behavior disorder: Secondary | ICD-10-CM

## 2016-01-05 DIAGNOSIS — R6889 Other general symptoms and signs: Secondary | ICD-10-CM | POA: Insufficient documentation

## 2016-01-05 MED ORDER — CLONAZEPAM 0.5 MG PO TABS: ORAL_TABLET | ORAL | Status: DC

## 2016-01-05 NOTE — Progress Notes (Signed)
[facility] SLEEP MEDICINE CONSULT   Provider:  Melvyn Novasarmen  Gera Inboden, Watkins D  Referring Provider: [name], [name], MD Primary Care Physician:  Devin PoundsUSSO,Devin M, MD  Chief Complaint  Patient presents with  . Follow-up    acting out in his sleep, on klonopin    HPI:  [name] is a 65 y.o. caucasian, married, right handed male , who is seen here as a follow up after a sleep study.  He advised me that he has not been diagnosed with Diverticulitis or hypertension as mentioned in his sleep study history . Sleep sStudy was performed on 01-30-14. It documented a very mild AHI of 6.8 and an RDI of 6.8 supine sleep and REM stage and it accentuates the AHI REM AHI was 11.6 in supine AHI was 14 based on this my main suggestion to reduce snoring or apnea would be to avoid the supine sleep position. The patient had 22 minutes total throughout the night of low oxygen levels the oxygen nadir was 88% which is not significant. He did have some trouble to stay asleep there were multiple episodes of REM behavior disorder nor does 1 was captured on video and was clearly understandable that the patient's poor blood and another episode in more detail,  that document of these behaviors. During REM sleep limb movement and his  muscle was still activated confirming the diagnosis of REM behavior disorder.  As we have discussed in our previous visit there is a higher overlap of REM behavior disorder leading to Parkinson's disease. The patient had started taking Klonopin and has noticed a significant decrease in the frequency of salsa he is combined at night with melatonin , which  seems to have complimented the Klonopin. [name] reports having vivid nightmares, usually being chased or attacked by an animal. He usually moves in bed- but at least once left the bed and tried to close and block the door. He has banged on the pillow , accidentally hitting his wife, who can display the bruises here.  He reported having  these dreams and acting out periods up to 4 times a night, and beginning much more infrequently the year before his Watkins attack in 2012.  He goes to bed around 11 PM and falls asleep promptly, his bedroom is quiet , dark and cool, he shares the bed with his wife.  By 1.30 or 2 AM he has often the  first display of REM BD . He reported not having vivid nightmares for about 6 month after his Watkins attack, which could be related to medication changes. He goes to the bathroom 2-3 times , and he has a high fluid intake, not caffeine, no ETOH, never been a smoker. The rise time in the morning is 5.30, he wakes spontaneously. He may sleep until 6 AM on weekends. He does not have a headache, he feels refreshed and restored. He snores, but his wife has not noted any apnea. Snoring is supine the loudest . He has a office with frosted glass window, and gets out of doors for lunch. 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   He has been hyperactive to some degree, always has  nervously moved his legs, tapped his feet.  No falls, no BP variability, no near syncope.   Interval history from 02-17-15. This is a yearly revisit, patient [name] last seen in July 2015, patent of Dr Timothy LassoUSSO.  [name] is followed here for REM behavior disorder we are requesting  him to fill sleep related questionnaires as well as memory or cognitive tests. He endorsed the geriatric depression score at 2 points which is clinically deemed insignificant the Epworth sleepiness score at 7 which is a normal range and the fatigue severity score at 20 points also in normal range. He did excellent with the Montral cognitive assessment test. I prefer to apply this test over her Mini-Mental Status Examination due to the patient's age and level of education. The patient was well able to pass the trail making test, copied a three-dimensional cube image and to the clock face. He was also able to name 3 animals do the serial 7 subtraction the  language obstruction and fluency test and he recalled 3 out of 5 recall words as well as being fully oriented to date time and place. The word fluency was 14 words beginning with the letter F. His score was 28 points out of a possible 30 and she lost 2 points to delayed recall. This is not indicative of significant short-term memory loss.  She reports no tremors no quivering voice, no falls or gait instability. No cogwheeling is noted. He also does not suffer from restless legs.  The patient reports that his REM behavior disorder has not affected him significantly over the last 12 months he did have a bout of several nights about 4 months ago. However this has not recurred since and he states that it was several months before that about that he had any REM behavior that his wife had witnessed. He remains of course  unawareof the disorder. He sometimes mumbles.  No hitting and no thrashing. He reports that he cannot recall his visit dreams that he used to have before.  Interval history from 01/05/2016. [name] is seen here today after a recent uptake in REM behavior activity. He reports that for a week or so he has been almost nightly engaged and acting out dreams but for the very last week prior to this visit the activity has ceased again. There is stress at work, he is 65 years old and feels unready to retire. He is ready to increase Klonopin.   Review of Systems:  Out of a complete 14 system review, the patient complains of only the following symptoms, and all other reviewed systems are negative. See above .   Social History   Social History  . Marital Status: Married    Spouse Name: [name]  . Number of Children: 1  . Years of Education: Bachelor's   Occupational History  . Not on file.   Social History Main Topics  . Smoking status: Never Smoker   . Smokeless tobacco: Never Used  . Alcohol Use: No     Comment: rarely  . Drug Use: No  . Sexual Activity: Not on file   Other  Topics Concern  . Not on file   Social History Narrative   Patient is married ([name]) and lives at home with his wife.   Patient has one adult child.   Patient is working full-time.   Patient has a Bachelor's degree.   Patient is right-handed.   Patient drinks one cup of tea  daily.    Family History  Problem Relation Age of Onset  . Cancer Mother     esophagus  . Watkins disease Mother   . Hypertension Mother   . Esophageal cancer Mother   . Cancer Father     spinal   . Colon cancer Neg Hx   .  Rectal cancer Neg Hx   . Stomach cancer Neg Hx     Past Medical History  Diagnosis Date  . Colon polyps     adenomatous  . Diverticulosis   . RBD (REM behavioral disorder) 02/14/2014  . Hyperlipidemia   . Hypertensive Watkins disease   . CAD (coronary artery disease)     a. Inf MI/PCI: 100% pRCA (4.0x22 Integrity BMS->dil to 4.70mm)-->MI complicated by VF and CGS;  b. 04/2012 MV: EF 65%, inf attenuation, no ischemia.    Past Surgical History  Procedure Laterality Date  . Coronary stent placement      03/2011  . Broken wrist    . Wisdom tooth extraction    . Colonoscopy      Current Outpatient Prescriptions  Medication Sig Dispense Refill  . aspirin 81 MG tablet Take 81 mg by mouth daily.    . cholecalciferol (VITAMIN D) 1000 UNITS tablet Take 1,000 Units by mouth daily.    . clonazePAM (KLONOPIN) 0.5 MG tablet Take 1 tablet (0.5 mg total) by mouth at bedtime. 90 tablet 0  . Melatonin 5 MG TABS Take 2 tablets by mouth at bedtime.    . metoprolol tartrate (LOPRESSOR) 25 MG tablet TAKE 0.5 TABLETS (12.5 MG TOTAL) BY MOUTH 2 (TWO) TIMES DAILY. 30 tablet 10  . Multiple Vitamin (MULTIVITAMIN) tablet Take 1 tablet by mouth daily.    . niacin (NIASPAN) 500 MG CR tablet Take 1 tablet (500 mg total) by mouth at bedtime. 30 tablet 6  . rosuvastatin (CRESTOR) 20 MG tablet TAKE 1 TABLET (20 MG TOTAL) BY MOUTH DAILY. 30 tablet 5   No current facility-administered medications for this  visit.    Allergies as of 01/05/2016  . (No Known Allergies)    Vitals: BP 112/76 mmHg  Pulse 62  Resp 20  Ht 6' (1.829 Watkins)  Wt 187 lb (84.823 kg)  BMI 25.36 kg/m2 Last Weight:  Wt Readings from Last 1 Encounters:  01/05/16 187 lb (84.823 kg)   Last Height:   Ht Readings from Last 1 Encounters:  01/05/16 6' (1.829 Watkins)    Physical exam:  General: The patient is awake, alert and appears not in acute distress. The patient is well groomed. Head: Normocephalic, atraumatic. Neck is supple. Mallampati 4  neck circumference: 15.5  Inches . He has TMJ click on the left, no delayed swallowing.  Cardiovascular:  Regular rate and rhythm ,  without  murmurs or carotid bruit, and without distended neck veins. Respiratory: Lungs are clear to auscultation. Skin:  Without evidence of edema, or rash Trunk:  Patient has normal posture.  Neurologic exam : The patient is awake and alert, oriented to place and time.  Memory : performed 2016 a  MOCA with patient , who lost 2 of 5 words in recall. No repeat in 2017  There is a normal attention span & concentration ability.  Speech is fluent without  dysarthria, dysphonia or aphasia. Mood and affect are appropriate.  Cranial nerves: Pupils are equal and briskly reactive to light. Funduscopic exam without  evidence of pallor or edema.   Floaters  In the right lens. Extraocular movements  in vertical and horizontal planes intact and without nystagmus. Visual fields by finger perimetry are intact. Hearing to finger rub intact.  Facial sensation intact to fine touch. Facial motor strength is symmetric and tongue and uvula move midline. Motor exam:  Normal tone , muscle bulk and symmetric normal strength in all extremities.  He has no  longer cogwheel rigidity over the left biceps, not the wrist not the shoulder, this could related to a C5 radiculopathy.  Sensory:  Fine touch, pinprick and vibration were tested in all extremities. No  deficits. Coordination: Rapid alternating movements in the fingers/hands is tested and normal.  Finger-to-nose maneuver tested and normal without evidence of ataxia, but there was mild dysmetria,  there was a mild action tremor in the right. Gait and station: Patient walks without assistive device, steady and with normal armswing.  Strength within normal limits. Stance is stable and normal.  Tandem gait is intact, turns with 2 Steps, which  are unfragmented. Romberg testing is normal. Deep tendon reflexes: in the  upper and lower extremities are symmetric and intact.   Assessment:  After physical and neurologic examination, review of laboratory studies, imaging, neurophysiology testing and pre-existing records, assessment is   REM BD and snoring, kicking and nightmares.  Confirmed REM BD in sleep study,    Plan:  Klonopin 0.5 mg nightly. Continue medication, yearly RV for REM BD with MOCA.     Ezechiel Stooksbury, MD

## 2016-01-05 NOTE — Patient Instructions (Addendum)
Clonazepam tablets What is this medicine? CLONAZEPAM (kloe NA ze pam) is a benzodiazepine. It is used to treat certain types of seizures. It is also used to treat panic disorder. This medicine may be used for other purposes; ask your health care provider or pharmacist if you have questions. What should I tell my health care provider before I take this medicine? They need to know if you have any of these conditions: -an alcohol or drug abuse problem -bipolar disorder, depression, psychosis or other mental health condition -glaucoma -kidney or liver disease -lung or breathing disease -myasthenia gravis -Parkinson's disease -porphyria -seizures or a history of seizures -suicidal thoughts -an unusual or allergic reaction to clonazepam, other benzodiazepines, foods, dyes, or preservatives -pregnant or trying to get pregnant -breast-feeding How should I use this medicine? Take this medicine by mouth with a glass of water. Follow the directions on the prescription label. If it upsets your stomach, take it with food or milk. Take your medicine at regular intervals. Do not take it more often than directed. Do not stop taking or change the dose except on the advice of your doctor or health care professional. A special MedGuide will be given to you by the pharmacist with each prescription and refill. Be sure to read this information carefully each time. Talk to your pediatrician regarding the use of this medicine in children. Special care may be needed. Overdosage: If you think you have taken too much of this medicine contact a poison control center or emergency room at once. NOTE: This medicine is only for you. Do not share this medicine with others. What if I miss a dose? If you miss a dose, take it as soon as you can. If it is almost time for your next dose, take only that dose. Do not take double or extra doses. What may interact with this medicine? -herbal or dietary supplements -medicines for  depression, anxiety, or psychotic disturbances -medicines for fungal infections like fluconazole, itraconazole, ketoconazole, voriconazole -medicines for HIV infection or AIDS -medicines for sleep -prescription pain medicines -propantheline -rifampin -sevelamer -some medicines for seizures like carbamazepine, phenobarbital, phenytoin, primidone This list may not describe all possible interactions. Give your health care provider a list of all the medicines, herbs, non-prescription drugs, or dietary supplements you use. Also tell them if you smoke, drink alcohol, or use illegal drugs. Some items may interact with your medicine. What should I watch for while using this medicine? Visit your doctor or health care professional for regular checks on your progress. Your body may become dependent on this medicine. If you have been taking this medicine regularly for some time, do not suddenly stop taking it. You must gradually reduce the dose or you may get severe side effects. Ask your doctor or health care professional for advice before increasing or decreasing the dose. Even after you stop taking this medicine it can still affect your body for several days. If you suffer from several types of seizures, this medicine may increase the chance of grand mal seizures (epilepsy). Let your doctor or health care professional know, he or she may want to prescribe an additional medicine. You may get drowsy or dizzy. Do not drive, use machinery, or do anything that needs mental alertness until you know how this medicine affects you. To reduce the risk of dizzy and fainting spells, do not stand or sit up quickly, especially if you are an older patient. Alcohol may increase dizziness and drowsiness. Avoid alcoholic drinks. Do not treat   yourself for coughs, colds or allergies without asking your doctor or health care professional for advice. Some ingredients can increase possible side effects. The use of this medicine may  increase the chance of suicidal thoughts or actions. Pay special attention to how you are responding while on this medicine. Any worsening of mood, or thoughts of suicide or dying should be reported to your health care professional right away. Women who become pregnant while using this medicine may enroll in the [place] Antiepileptic Drug Pregnancy Registry by calling [phone]. This registry collects information about the safety of antiepileptic drug use during pregnancy. What side effects may I notice from receiving this medicine? Side effects that you should report to your doctor or health care professional as soon as possible: -allergic reactions like skin rash, itching or hives, swelling of the face, lips, or tongue -changes in vision -confusion -depression -hallucinations -mood changes, excitability or aggressive behavior -movement difficulty, staggering or jerky movements -muscle cramps, weakness -tremors -unusual eye movements Side effects that usually do not require medical attention (report to your doctor or health care professional if they continue or are bothersome): -constipation or diarrhea -difficulty sleeping, nightmares -dizziness, drowsiness -headache -increased saliva from your mouth -nausea, vomiting This list may not describe all possible side effects. Call your doctor for medical advice about side effects. You may report side effects to FDA at 1-800-FDA-1088. Where should I keep my medicine? Keep out of the reach of children. This medicine can be abused. Keep your medicine in a safe place to protect it from theft. Do not share this medicine with anyone. Selling or giving away this medicine is dangerous and against the law. This medicine may cause accidental overdose and death if taken by other adults, children, or pets. Mix any unused medicine with a substance like cat litter or coffee grounds. Then throw the medicine away in a sealed container like a sealed  bag or a coffee can with a lid. Do not use the medicine after the expiration date. Store at room temperature between 15 and 30 degrees C (59 and 86 degrees F). Protect from light. Keep container tightly closed. NOTE: This sheet is a summary. It may not cover all possible information. If you have questions about this medicine, talk to your doctor, pharmacist, or health care provider.    2016, Elsevier/Gold Standard. (2014-10-14 14:01:43)   Continue MELATONIN alongsite KLONOPIN.

## 2016-02-01 ENCOUNTER — Encounter: Payer: Self-pay | Admitting: Neurology

## 2016-02-17 ENCOUNTER — Ambulatory Visit: Payer: PRIVATE HEALTH INSURANCE | Admitting: Neurology

## 2016-04-14 ENCOUNTER — Telehealth: Payer: Self-pay | Admitting: Cardiovascular Disease

## 2016-04-14 NOTE — Telephone Encounter (Signed)
New message    Stress at work has been a lot due to point of pain in chest .  Patient verbalized hard to describe.     Pt c/o of Chest Pain: STAT if CP now or developed within 24 hours  1. Are you having CP right now? No   2. Are you experiencing any other symptoms (ex. SOB, nausea, vomiting, sweating)? No   3. How long have you been experiencing CP?  Feel the stress at work h/o MI before   4. Is your CP continuous or coming and going? Feeling likes- happen when reach point stress level .   5. Have you taken Nitroglycerin? no ?

## 2016-04-14 NOTE — Telephone Encounter (Signed)
Returned call to patient. He said he experiences a "pounding in his chest" during times of high stress at work or different situations. He gave the example that he was on vacation last week and he received numerous emails needing to fix things at work because he's the only one who can handle the issues. During that time he experienced the heart pounding feeling. It lasted less than a minute.  He says the feeling has never lasted more than a minute. It occurs during times of anxiety or stress. Denies SOB, sweating, dizziness, or heaviness on chest during these episodes. Patient has f/u with Dr Tresa EndoKelly 05/24/16 and PCP in November as well. Encouraged patient to explore coping mechanisms to handle stress at work and to keep appointments. Encouraged patient to f/u with PCP sooner if needed. Patient verbalized understanding and to go to ER or call 911 if he has chest pain, SOB, sweating and/or nausea that worsens and lasts.  Will route to Dr Tresa EndoKelly for review and further advice if needed.

## 2016-05-08 NOTE — Telephone Encounter (Signed)
Suspect he is having stress mediated ectopy; if continues can give metoprolol tatrate 25 mg PRN

## 2016-05-09 NOTE — Telephone Encounter (Signed)
Left msg to call.

## 2016-05-21 ENCOUNTER — Other Ambulatory Visit: Payer: Self-pay | Admitting: Cardiovascular Disease

## 2016-05-24 ENCOUNTER — Ambulatory Visit: Payer: PRIVATE HEALTH INSURANCE | Admitting: Cardiovascular Disease

## 2016-06-03 ENCOUNTER — Encounter: Payer: Self-pay | Admitting: Cardiovascular Disease

## 2016-06-03 ENCOUNTER — Ambulatory Visit (INDEPENDENT_AMBULATORY_CARE_PROVIDER_SITE_OTHER): Payer: BLUE CROSS/BLUE SHIELD | Admitting: Cardiovascular Disease

## 2016-06-03 VITALS — BP 100/62 | HR 54 | Ht 72.0 in | Wt 197.0 lb

## 2016-06-03 DIAGNOSIS — I2510 Atherosclerotic heart disease of native coronary artery without angina pectoris: Secondary | ICD-10-CM | POA: Diagnosis not present

## 2016-06-03 DIAGNOSIS — G4752 REM sleep behavior disorder: Secondary | ICD-10-CM

## 2016-06-03 DIAGNOSIS — I252 Old myocardial infarction: Secondary | ICD-10-CM

## 2016-06-03 DIAGNOSIS — E785 Hyperlipidemia, unspecified: Secondary | ICD-10-CM

## 2016-06-03 DIAGNOSIS — I119 Hypertensive heart disease without heart failure: Secondary | ICD-10-CM

## 2016-06-03 DIAGNOSIS — Z79899 Other long term (current) drug therapy: Secondary | ICD-10-CM | POA: Diagnosis not present

## 2016-06-03 NOTE — Patient Instructions (Signed)
Your physician wants you to follow-up in: 1 YEAR OR SOONER IF NEEDED. You will receive a reminder letter in the mail two months in advance. If you don't receive a letter, please call our office to schedule the follow-up appointment.   If you need a refill on your cardiac medications before your next appointment, please call your pharmacy. 

## 2016-06-05 NOTE — Progress Notes (Signed)
Patient ID: [name], male   DOB: 09-10-1950, 65 y.o.   MRN: [id number]        HPI: [name] is a 65 y.o. male who presents for an 97 month cardiology evaluation.  [name] developed an acute coronary syndrome/ST segment elevation inferior wall myocardial infarction on 04/16/2011. At that time, he had never had any cardiac symptoms. Immediately upon arrival to the catheterization laboratory he developed recurrent episodes of ventricular fibrillation and required multiple defibrillations. At catheterization he was found to have total proximal RCA occlusion in a very dominant vessel. He was in cardiogenic shock initially had significant for wall motion abnormality. I performed successful intervention and ultimately placed a 4.0x22 mm integrity stent postdilated to 4.38 mm in a large proximal right coronary artery. Ejection fraction ultimately improved to approximately 50-55%. A nuclear perfusion study in October 2013 showed essentially complete salvage of myocardium. He was on Brilinta for approximately 15 months and ultimately this was discontinued and he has been on aspirin alone. Also has been on combination therapy with low-dose Niaspan Crestor in addition to low-dose beta blocker therapy.  Since I last saw him he has remained cardiac stable.  He denies recurrent chest pain.  There are no palpitations.  He denies any PND or orthopnea.  He is unaware of any episodes of tachycardia.  He denies presyncope or syncope.  He does admit to increased work related stress.  However, he is managing to exercise 5 days per week.  He sees Dr. Virgina Watkins, for primary care .  Laboratory has been checked by Dr. Virgina Watkins.  He  was diagnosed as having insomnia with a REM sleep behavior disorder associated with  vivid frightful dreams, thrashing with viscious animals.  He has seen Dr. Roddie Watkins and was started on Klonopin with some improvement in his symptomatology.  When he was last seen by her, Klonopin dose was increased.   He now takes 1.5 mg at bedtime.  He underwent a four-year follow-up nuclear perfusion study on 11/24/2015, which remained normal.  There was mild inferolateral attenuation but no evidence for prior infarction or ischemia.  Ejection fraction was 50%.  He presents for reevaluation.   Past Medical History:  Diagnosis Date  . CAD (coronary artery disease)    a. Inf MI/PCI: 100% pRCA (4.0x22 Integrity BMS->dil to 3.24MW)-->NU complicated by VF and CGS;  b. 04/2012 MV: EF 65%, inf attenuation, no ischemia.  . Colon polyps    adenomatous  . Diverticulosis   . Hyperlipidemia   . Hypertensive heart disease   . RBD (REM behavioral disorder) 02/14/2014    Past Surgical History:  Procedure Laterality Date  . broken wrist    . COLONOSCOPY    . CORONARY STENT PLACEMENT     03/2011  . WISDOM TOOTH EXTRACTION      No Known Allergies  Current Outpatient Prescriptions  Medication Sig Dispense Refill  . aspirin 81 MG tablet Take 81 mg by mouth daily.    . cholecalciferol (VITAMIN D) 1000 UNITS tablet Take 1,000 Units by mouth daily.    . clonazePAM (KLONOPIN) 0.5 MG tablet 0.75 mg at bedtime po. (Patient taking differently: Take 1.5 mg by mouth daily. 0.75 mg at bedtime po.) 135 tablet 1  . Melatonin 5 MG TABS Take 2 tablets by mouth at bedtime.    . metoprolol tartrate (LOPRESSOR) 25 MG tablet TAKE 0.5 TABLETS (12.5 MG TOTAL) BY MOUTH 2 (TWO) TIMES DAILY. 30 tablet 10  . Multiple Vitamin (MULTIVITAMIN) tablet Take 1  tablet by mouth daily.    . niacin (NIASPAN) 500 MG CR tablet Take 1 tablet (500 mg total) by mouth at bedtime. 30 tablet 6  . rosuvastatin (CRESTOR) 20 MG tablet TAKE 1 TABLET (20 MG TOTAL) BY MOUTH DAILY. 30 tablet 5   No current facility-administered medications for this visit.     Social History   Social History  . Marital status: Married    Spouse name: [name]  . Number of children: 1  . Years of education: Bachelor's   Occupational History  . Not on file.   Social  History Main Topics  . Smoking status: Never Smoker  . Smokeless tobacco: Never Used  . Alcohol use No     Comment: rarely  . Drug use: No  . Sexual activity: Not on file   Other Topics Concern  . Not on file   Social History Narrative   Patient is married ([name]) and lives at home with his wife.   Patient has one adult child.   Patient is working full-time.   Patient has a Bachelor's degree.   Patient is right-handed.   Patient drinks one cup of tea  daily.   Socially, he is married.  He works in Engineer, technical sales.  He is one child.  He exercises 5 days per week.  There is no tobacco use  Family History  Problem Relation Age of Onset  . Cancer Mother     esophagus  . Heart disease Mother   . Hypertension Mother   . Esophageal cancer Mother   . Cancer Father     spinal   . Colon cancer Neg Hx   . Rectal cancer Neg Hx   . Stomach cancer Neg Hx    ROS General: Negative; No fevers, chills, or night sweats;  HEENT: Negative; No changes in vision or hearing, sinus congestion, difficulty swallowing Pulmonary: Negative; No cough, wheezing, shortness of breath, hemoptysis Cardiovascular: Negative; No chest pain, presyncope, syncope, palpitations GI: Negative; No nausea, vomiting, diarrhea, or abdominal pain GU: Negative; No dysuria, hematuria, or difficulty voiding Musculoskeletal: Negative; no myalgias, joint pain, or weakness Hematologic/Oncology: Negative; no easy bruising, bleeding Endocrine: Negative; no heat/cold intolerance; no diabetes Neuro: Negative; no changes in balance, headaches Skin: Negative; No rashes or skin lesions Psychiatric: Negative; No behavioral problems, depression Sleep: Positive for REM sleep behavior disorder/parasomnia; No snoring, daytime sleepiness, hypersomnolence, bruxism, restless legs, hypnogognic hallucinations, no cataplexy Other comprehensive 14 point system review is negative.   PE BP 100/62 (BP Location: Right Arm, Patient Position: Sitting, Cuff  Size: Normal)   Pulse (!) 54   Ht 6' (1.829 m)   Wt 197 lb (89.4 kg)   BMI 26.72 kg/m    Repeat blood pressure by me 118/64 supine and 108/68 standing.  Wt Readings from Last 3 Encounters:  06/03/16 197 lb (89.4 kg)  01/05/16 187 lb (84.8 kg)  11/24/15 192 lb (87.1 kg)   General: Alert, oriented, no distress.  Skin: normal turgor, no rashes HEENT: Normocephalic, atraumatic. Pupils round and reactive; sclera anicteric;no lid lag.  Nose without nasal septal hypertrophy Mouth/Parynx benign; Mallinpatti scale 2 Neck: No JVD, no carotid bruits with normal carotid upstroke Lungs: clear to ausculatation and percussion; no wheezing or rales Chest wall: Nontender to palpation Heart: PMI is not displaced.  RRR, s1 s2 normal .  No S3 or S4 gallop.  No diastolic murmurs, rubs, thrills or heaves Abdomen: soft, nontender; no hepatosplenomehaly, BS+; abdominal aorta nontender and not dilated by palpation. Back:  No CVA tenderness Pulses 2+ Extremities: no clubbing cyanosis or edema, Homan's sign negative  Neurologic: grossly nonfocal Psychological: Normal affect and mood.  ECG (independently read by me): Sinus bradycardia 54 bpm.  ECG shows small inferior Q waves with preserved R waves.  May 2016 ECG (independently read by me): Sinus bradycardia 49 bpm.  No ectopy.  Normal intervals.  No ST segment changes.  April 2015 ECG today (Independently read by me): Sinus rhythm at 58 beats per minute.  No significant ST changes.  No evidence for prior myocardial infarction.  Prior 04/25/2013: ECG: Sinus bradycardia at 53 beats per minute. Normal. Very diminutive nondiagnostic Q waves in II, III, and F with preserved R waves concordant with his myocardial salvage   LABS: BMP Latest Ref Rng & Units 04/19/2011 04/17/2011 04/16/2011  Glucose 70 - 99 mg/dL 92 104(H) 140(H)  BUN 6 - 23 mg/dL 8 7 10   Creatinine 0.50 - 1.35 mg/dL 0.73 0.69 0.73  Sodium 135 - 145 mEq/L 137 135 136  Potassium 3.5 - 5.1 mEq/L  3.7 3.7 4.4  Chloride 96 - 112 mEq/L 104 105 107  CO2 19 - 32 mEq/L 25 24 21   Calcium 8.4 - 10.5 mg/dL 9.0 8.3(L) 7.9(L)   Hepatic Function Latest Ref Rng & Units 04/16/2011 04/16/2011  Total Protein 6.0 - 8.3 g/dL 5.7(L) 6.1  Albumin 3.5 - 5.2 g/dL 3.2(L) 3.6  AST 0 - 37 U/L 89(H) 14  ALT 0 - 53 U/L 18 11  Alk Phosphatase 39 - 117 U/L 60 64  Total Bilirubin 0.3 - 1.2 mg/dL 0.4 0.4   CBC Latest Ref Rng & Units 04/17/2011 04/16/2011 04/16/2011  WBC 4.0 - 10.5 K/uL 11.1(H) 13.2(H) -  Hemoglobin 13.0 - 17.0 g/dL 12.8(L) 13.1 13.9  Hematocrit 39.0 - 52.0 % 37.1(L) 36.7(L) 41.0  Platelets 150 - 400 K/uL 134(L) 162 -   No results found for: HGBA1C  Lab Results  Component Value Date   TSH 0.566 04/16/2011   Lipid Panel     Component Value Date/Time   CHOL 165 04/16/2011 0700   TRIG 65 04/16/2011 0700   HDL 49 04/16/2011 0700   CHOLHDL 3.4 04/16/2011 0700   VLDL 13 04/16/2011 0700   LDLCALC 103 (H) 04/16/2011 0700      RADIOLOGY: No results found.    ASSESSMENT AND PLAN: Mr. Hykeem Ojeda is a 65 year old male who is  5 years following his acute coronary syndrome/inferior wall ST segment elevation myocardial infarction in 11/5972 which was complicated by recurrent episodes of ventricular fibrillation, cardiogenic shock which  required multiple defibrillations. He has been demonstrated to have essentially complete salvage of myocardium in the RCA territory. He remains asymptomatic. His other coronary arteries were free of significant disease.   He has been on a medical regimen, consisting of metoprolol tartrate 12.5 mg twice a day and his heart rate remains bradycardic. Marland Kitchen  He had been on dual antiplatelet therapy with aspirin and Brilinta for 15 months.  When I last saw him, he had been on aspirin alone and I discussed with him both the Pegasus trial as well as the DAPP trial.  With his life-threatening event years ago, and with the benefit of recent data, I  recommended resumption of dual  antiplatelet therapy and will start clopidogrel 75 mg daily to take with his baby aspirin.  He had taken this for short duration but may have had some problems, and as result is now just back on aspirin alone.  He has  a history of vivid, frightful dreams and is felt to have a REM sleep behavior disorder which has improved with clonazepam and he also takes melatonin for improved sleep.  He is aware of the potential increase incidence of Parkinson's disease or Lewy bodydegenerative disorder which can be associated with the rim behavior parasomnia.  For this reason, he is contemplating whether or not to retire over the next several years, rather than work for significant longer duration.  He continues to be on aggressive lipid-lowering therapy for mixed hyperlipidemia and continues to take Crestor 20 mg in addition to low-dose niacin 500 mg.  he underwent a four-year follow-up nuclear perfusion study in May 2017, which remain normal.  Ejection fraction was 50%.  There was no evidence for scar or ischemia.  He had blood work by Dr. Hebert Soho.  I will try to obtain these results.  Target LDL is less than 70.  As long as he remains stable I will see him in one year for reevaluation.  Time spent: 25 minutes  Troy Sine, MD, [facility]  06/05/2016 7:43 AM

## 2016-07-04 ENCOUNTER — Other Ambulatory Visit: Payer: Self-pay | Admitting: Neurology

## 2016-07-04 DIAGNOSIS — R6889 Other general symptoms and signs: Secondary | ICD-10-CM

## 2016-07-04 DIAGNOSIS — G478 Other sleep disorders: Secondary | ICD-10-CM

## 2016-07-04 DIAGNOSIS — G4752 REM sleep behavior disorder: Secondary | ICD-10-CM

## 2016-07-30 ENCOUNTER — Other Ambulatory Visit: Payer: Self-pay | Admitting: Cardiovascular Disease

## 2016-08-01 NOTE — Telephone Encounter (Signed)
Rx(s) sent to pharmacy electronically.  

## 2016-10-03 ENCOUNTER — Other Ambulatory Visit: Payer: Self-pay | Admitting: Neurology

## 2016-10-03 DIAGNOSIS — R6889 Other general symptoms and signs: Secondary | ICD-10-CM

## 2016-10-03 DIAGNOSIS — G478 Other sleep disorders: Secondary | ICD-10-CM

## 2016-10-03 DIAGNOSIS — G4752 REM sleep behavior disorder: Secondary | ICD-10-CM

## 2016-10-04 NOTE — Telephone Encounter (Signed)
RX for klonopin faxed to [facility]. Received a receipt of confirmation.  

## 2016-10-09 ENCOUNTER — Other Ambulatory Visit: Payer: Self-pay | Admitting: Cardiovascular Disease

## 2016-10-24 ENCOUNTER — Encounter: Payer: Self-pay | Admitting: Neurology

## 2016-12-23 ENCOUNTER — Other Ambulatory Visit: Payer: Self-pay | Admitting: Cardiovascular Disease

## 2017-01-02 ENCOUNTER — Telehealth: Payer: Self-pay | Admitting: Neurology

## 2017-01-02 ENCOUNTER — Ambulatory Visit: Payer: BLUE CROSS/BLUE SHIELD | Admitting: Family Medicine

## 2017-01-02 DIAGNOSIS — G478 Other sleep disorders: Secondary | ICD-10-CM

## 2017-01-02 DIAGNOSIS — R6889 Other general symptoms and signs: Secondary | ICD-10-CM

## 2017-01-02 DIAGNOSIS — G4752 REM sleep behavior disorder: Secondary | ICD-10-CM

## 2017-01-03 NOTE — Telephone Encounter (Addendum)
Spoke with pharmacist, [name][facility] who stated that currently the medication may be available July 16th, but he stated that is subject to change and go out further.  This RN inquired if he recommended a replacement medication. [name] stated that he actually received a call from patient stating he was willing to use clonazepam 1.0 mg tablets and quarter them to continue to take his current dose. [name] stated if another medication was prescribed, a tapering schedule would need to be put in place for clonazepam. Patient has been on clonazepam for several years. This RN stated she will call patient to discuss.  Spoke with Mr Gwenith SpitzMcKeen who stated he would like to split tabs. He stated that next week he would like Dr Dohmeier's input re: whether she recommends another medication or not. He has follow up with Dr Vickey Hugerohmeier 02/07/17, but he will run out of medication before his FU. He is requesting to be called next week with Dr Dohmeier's reply. This RN stated she would route note back to HollymeadKristen to call next week. Patient verbalized understanding, appreciation.

## 2017-01-03 NOTE — Telephone Encounter (Signed)
Pt has been informed by [facility] that clonazePAM (KLONOPIN) 0.5 MG tablet it not available anywhere within the [facility] network and they have not ETA as to when it will ever be.  Pt asking for a call as to what can be called in for him to take in place of clonazePAM (KLONOPIN) 0.5 MG tablet

## 2017-01-03 NOTE — Telephone Encounter (Signed)
RX for klonopin faxed to [facility]. Received a receipt of confirmation.  

## 2017-01-04 ENCOUNTER — Ambulatory Visit: Payer: BLUE CROSS/BLUE SHIELD | Admitting: Neurology

## 2017-01-08 IMAGING — NM NM MISC PROCEDURE
6 series · 36 of 36 positions shown · non-contrast
Comparison: none

[Series 1: wbr_r-proj_st wbr rest · 6.40mm/px · 6 of 64 frames shown]
[frame 6/64]
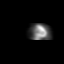
[frame 16/64]
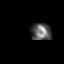
[frame 27/64]
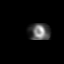
[frame 38/64]
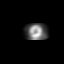
[frame 48/64]
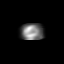
[frame 59/64]
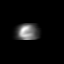

[Series 1: wbr rest · 6.40mm/px · 6 of 64 frames shown]
[frame 6/64]
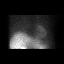
[frame 16/64]
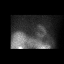
[frame 27/64]
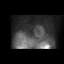
[frame 38/64]
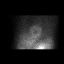
[frame 48/64]
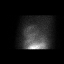
[frame 59/64]
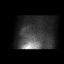

[Series 2: wbr_s-proj_st wbr stress-gsp · 6.40mm/px · 6 of 512 frames shown]
[frame 43/512]
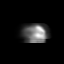
[frame 128/512]
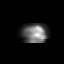
[frame 214/512]
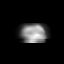
[frame 299/512]
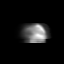
[frame 384/512]
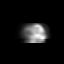
[frame 470/512]
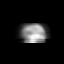

[Series 2: wbr stress-gsp · 6.40mm/px · 6 of 512 frames shown]
[frame 43/512]
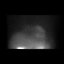
[frame 128/512]
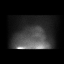
[frame 214/512]
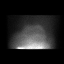
[frame 299/512]
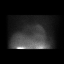
[frame 384/512]
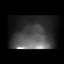
[frame 470/512]
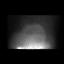

[Series 3: wbr_s-proj_st wbr stress-sum-em · 6.40mm/px · 6 of 64 frames shown]
[frame 6/64]
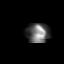
[frame 16/64]
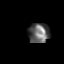
[frame 27/64]
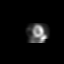
[frame 38/64]
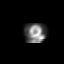
[frame 48/64]
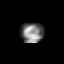
[frame 59/64]
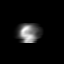

[Series 3: wbr stress-sum-em · 6.40mm/px · 6 of 64 frames shown]
[frame 6/64]
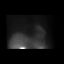
[frame 16/64]
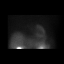
[frame 27/64]
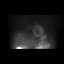
[frame 38/64]
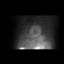
[frame 48/64]
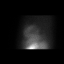
[frame 59/64]
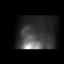

[36 of 36 positions shown; findings below may reference images not displayed]

Canned report from images found in remote index.

Refer to host system for actual result text.

## 2017-01-10 ENCOUNTER — Telehealth: Payer: Self-pay | Admitting: *Deleted

## 2017-01-10 MED ORDER — CLONAZEPAM 1 MG PO TABS: ORAL_TABLET | ORAL | 3 refills | Status: DC

## 2017-01-10 NOTE — Telephone Encounter (Signed)
Clonazepam rx faxed and confirmed to [facility] at [phone].

## 2017-01-10 NOTE — Addendum Note (Signed)
Addended by: Melvyn NovasHMEIER, [place]ah Sporrer on: 01/10/2017 05:20 PM   Modules accepted: Orders

## 2017-01-10 NOTE — Telephone Encounter (Signed)
I would not want to jeopardize the patient's parasomnia treatment by changing him to a different medication at this point. If Klonopin is no longer made at 0.5 or 0.25 mg doses I will be happy to prescribe 1 mg tablets which the patient can quarter. I will [facility] 1 mg tablets to be taken at a quarter or two quarters at night as needed. 30 tabs.

## 2017-01-11 ENCOUNTER — Ambulatory Visit (INDEPENDENT_AMBULATORY_CARE_PROVIDER_SITE_OTHER): Payer: BLUE CROSS/BLUE SHIELD | Admitting: Family Medicine

## 2017-01-11 ENCOUNTER — Encounter: Payer: Self-pay | Admitting: Family Medicine

## 2017-01-11 VITALS — BP 138/88 | HR 59 | Ht 72.0 in | Wt 199.0 lb

## 2017-01-11 DIAGNOSIS — M5416 Radiculopathy, lumbar region: Secondary | ICD-10-CM

## 2017-01-11 DIAGNOSIS — M25551 Pain in right hip: Secondary | ICD-10-CM

## 2017-01-11 DIAGNOSIS — IMO0001 Reserved for inherently not codable concepts without codable children: Secondary | ICD-10-CM | POA: Insufficient documentation

## 2017-01-11 MED ORDER — VITAMIN D (ERGOCALCIFEROL) 1.25 MG (50000 UNIT) PO CAPS: 50000.0000 [IU] | ORAL_CAPSULE | ORAL | 0 refills | Status: DC

## 2017-01-11 MED ORDER — VENLAFAXINE HCL ER 37.5 MG PO CP24: 37.5000 mg | ORAL_CAPSULE | Freq: Every day | ORAL | 1 refills | Status: DC

## 2017-01-11 NOTE — Telephone Encounter (Signed)
I called pt. I advised him that Dr. Vickey Hugerohmeier does not recommend changing him from klonopin at this time and recommends that he quarter the 1mg  tablets of klonopin for now. Pt says that [facility] advised him that they will not have ANY klonopin strengths available until sometime in July, but he will call them and find the status of this as of now, and call us back if this is indeed still the case.

## 2017-01-11 NOTE — Progress Notes (Signed)
[name] [address] [place], [place] [address] Phone: [phone] Subjective:    I'm seeing this patient by the request  of:  Creola Corn, MD   CC: Right hip   [id number]  [name] is a 66 y.o. male coming in with complaint of right hip pain. Patient has had some pain for quite some time. Patient has seen a plantar fasciitis. Patient has had significant workup over the course of 1-2 years. Patient did have an MRI that was independently visualized by me. MRI of the lumbar spine did show severe degenerative disc disease at L3-L4 significant nerve root impingement on the left sign. Patient though symptoms as well as been on the right side. Patient states that he has had multiple epidurals and did respond to the first one medicine's and has not. Patient did have some benefit with 1 piriformis injection on the right side as well. Patient states now that he continues him pain. Seems to be somewhat unrelenting. Patient states that it seems to be even worse when patient walks. Sometimes seems to get better with activity as well as with manual massage. Patient has tried acupuncture as well as some physical therapy with minimal benefits. Patient is concerned cousin if anything seems to be worsening over the course of time.     Past Medical History:  Diagnosis Date  . CAD (coronary artery disease)    a. Inf MI/PCI: 100% pRCA (4.0x22 Integrity BMS->dil to 4.32mm)-->MI complicated by VF and CGS;  b. 04/2012 MV: EF 65%, inf attenuation, no ischemia.  . Colon polyps    adenomatous  . Diverticulosis   . Hyperlipidemia   . Hypertensive heart disease   . RBD (REM behavioral disorder) 02/14/2014   Past Surgical History:  Procedure Laterality Date  . broken wrist    . COLONOSCOPY    . CORONARY STENT PLACEMENT     03/2011  . WISDOM TOOTH EXTRACTION     Social History   Social History  . Marital status: Married    Spouse name: Camelia Eng  . Number of children: 1    . Years of education: Bachelor's   Social History Main Topics  . Smoking status: Never Smoker  . Smokeless tobacco: Never Used  . Alcohol use No     Comment: rarely  . Drug use: No  . Sexual activity: Not Asked   Other Topics Concern  . None   Social History Narrative   Patient is married ([name]) and lives at home with his wife.   Patient has one adult child.   Patient is working full-time.   Patient has a Bachelor's degree.   Patient is right-handed.   Patient drinks one cup of tea  daily.   No Known Allergies Family History  Problem Relation Age of Onset  . Cancer Mother        esophagus  . Heart disease Mother   . Hypertension Mother   . Esophageal cancer Mother   . Cancer Father        spinal   . Colon cancer Neg Hx   . Rectal cancer Neg Hx   . Stomach cancer Neg Hx     Past medical history, social, surgical and family history all reviewed in electronic medical record.  No pertanent information unless stated regarding to the chief complaint.   Review of Systems:Review of systems updated and as accurate as of 01/11/17  No headache, visual changes, nausea, vomiting, diarrhea, constipation, dizziness, abdominal pain,  skin rash, fevers, chills, night sweats, weight loss, swollen lymph nodes, body aches, joint swelling, muscle aches, chest pain, shortness of breath, mood changes.   Objective  Height 6' (1.829 m), weight 199 lb (90.3 kg). Systems examined below as of 01/11/17   General: No apparent distress alert and oriented x3 mood and affect normal, dressed appropriately.  HEENT: Pupils equal, extraocular movements intact  Respiratory: Patient's speak in full sentences and does not appear short of breath  Cardiovascular: No lower extremity edema, non tender, no erythema  Skin: Warm dry intact with no signs of infection or rash on extremities or on axial skeleton.  Abdomen: Soft nontender  Neuro: Cranial nerves II through XII are intact, neurovascularly intact in  all extremities with 2+ DTRs and 2+ pulses.  Lymph: No lymphadenopathy of posterior or anterior cervical chain or axillae bilaterally.  Gait normal with good balance and coordination.  MSK:  Non tender with full range of motion and good stability and symmetric strength and tone of shoulders, elbows, wrist, hip, knee and ankles bilaterally.  Back Exam:  Inspection: Loss of some lordosis with patient having some atrophy of the right buttocks compared to contralateral sign Motion: Flexion 40 deg, Extension 25 deg, Side Bending to 35 deg bilaterally,  Rotation to 45 deg bilaterally  SLR laying: Positive right side XSLR laying: Negative  Palpable tenderness: Moderate to severe tenderness and appears palmar musculature at L5-S1 as well as over the piriformis. FABER: negative. Sensory change: Gross sensation intact to all lumbar and sacral dermatomes.  Reflexes: 2+ at both patellar tendons, 2+ at achilles tendons, Babinski's downgoing.  Strength at foot  Plantar-flexion: 5/5 Dorsi-flexion: 5/5 Eversion: 5/5 Inversion: 5/5  Leg strength  Quad: 5/5 Hamstring: 5/5 Hip flexor: 5/5 Hip abductors: 3/5 on the right side compared to the contralateral sign. Gait unremarkable.  Procedure note 97110; 15 minutes spent for Therapeutic exercises as stated in above notes.  This included exercises focusing on stretching, strengthening, with significant focus on eccentric aspects.Piriformis Syndrome  Using an anatomical model, reviewed with the patient the structures involved and how they related to diagnosis. The patient indicated understanding.   The patient was given a handout from Dr. Ailene Ardsouzier's book "The Sports Medicine Patient Advisor" describing the anatomy and rehabilitation of the following condition: Piriformis Syndrome  Also given a handout with more extensive Piriformis stretching, hip flexor and abductor strengthening, ham stretching  Rec deep massage, explained self-massage with ball    Proper  technique shown and discussed handout in great detail with ATC.  All questions were discussed and answered.      Impression and Recommendations:     This case required medical decision making of moderate complexity.      Note: This dictation was prepared with Dragon dictation along with smaller phrase technology. Any transcriptional errors that result from this process are unintentional.

## 2017-01-11 NOTE — Patient Instructions (Signed)
Good to see you  Ice 20 minutes 2 times daily. Usually after activity and before bed. Exercises 3 times a week.  Tennis ball in back right pocket  We will get EMG  Effexor 37.5 mg daily  Once weekly vitamin D for next 12 weeks Over the counter  Turmeric 500mg  daily  Tart cherry extract at night  See me again in 4 weeks.

## 2017-01-11 NOTE — Assessment & Plan Note (Signed)
Patient has a history of pain quite some time. Does respond to some different interventions from time to time but over the course of 2 years has been worsening. Patient's presentation. Patient recently did try gabapentin by another provider and did have somnolence. We discussed Effexor and patient will try this at a low dose. We'll not increasing in area patient cardiovascular history. Once weekly vitamin D to help with muscle strength and endurance. Also discussed with patient about other over-the-counter medications a could be beneficial. Work with Event organiserathletic trainer to learn home exercises. EMG ordered to further evaluate this is more of a lumbar radiculopathy or potentially pinched at the piriformis. Follow-up again in 4 weeks

## 2017-01-13 ENCOUNTER — Other Ambulatory Visit: Payer: Self-pay | Admitting: *Deleted

## 2017-01-13 DIAGNOSIS — M25551 Pain in right hip: Secondary | ICD-10-CM

## 2017-01-26 ENCOUNTER — Other Ambulatory Visit: Payer: Self-pay

## 2017-01-26 MED ORDER — NIACIN ER (ANTIHYPERLIPIDEMIC) 500 MG PO TBCR: 500.0000 mg | EXTENDED_RELEASE_TABLET | Freq: Every day | ORAL | 1 refills | Status: DC

## 2017-02-07 ENCOUNTER — Ambulatory Visit (INDEPENDENT_AMBULATORY_CARE_PROVIDER_SITE_OTHER): Payer: BLUE CROSS/BLUE SHIELD | Admitting: Neurology

## 2017-02-07 ENCOUNTER — Encounter: Payer: Self-pay | Admitting: Neurology

## 2017-02-07 VITALS — BP 114/74 | HR 56 | Ht 72.0 in | Wt 194.0 lb

## 2017-02-07 DIAGNOSIS — F411 Generalized anxiety disorder: Secondary | ICD-10-CM | POA: Diagnosis not present

## 2017-02-07 DIAGNOSIS — F430 Acute stress reaction: Secondary | ICD-10-CM | POA: Diagnosis not present

## 2017-02-07 DIAGNOSIS — G4752 REM sleep behavior disorder: Secondary | ICD-10-CM

## 2017-02-07 DIAGNOSIS — R251 Tremor, unspecified: Secondary | ICD-10-CM

## 2017-02-07 DIAGNOSIS — G478 Other sleep disorders: Secondary | ICD-10-CM

## 2017-02-07 MED ORDER — CLONAZEPAM 1 MG PO TABS: ORAL_TABLET | ORAL | 3 refills | Status: DC

## 2017-02-07 NOTE — Progress Notes (Signed)
[facility] SLEEP MEDICINE CONSULT   Provider:  Melvyn Novasarmen  Chase Arnall, M D  Referring Provider: [name], [name], MD Primary Care Physician:  [name], [name], MD  Chief Complaint  Patient presents with  . Follow-up    HPI:         [name] is a 66 y.o. caucasian, married, right handed male , who is seen here as a follow up after a sleep study.  He advised me that he has not been diagnosed with Diverticulitis or hypertension as mentioned in his sleep study history . Sleep sStudy was performed on 01-30-14. It documented a very mild AHI of 6.8 and an RDI of 6.8 supine sleep and REM stage and it accentuates the AHI REM AHI was 11.6 in supine AHI was 14 based on this my main suggestion to reduce snoring or apnea would be to avoid the supine sleep position. The patient had 22 minutes total throughout the night of low oxygen levels the oxygen nadir was 88% which is not significant. He did have some trouble to stay asleep there were multiple episodes of REM behavior disorder nor does 1 was captured on video and was clearly understandable that the patient's poor blood and another episode in more detail,  that document of these behaviors. During REM sleep limb movement and his  muscle was still activated confirming the diagnosis of REM behavior disorder.  As we have discussed in our previous visit there is a higher overlap of REM behavior disorder leading to Parkinson's disease. The patient had started taking Klonopin and has noticed a significant decrease in the frequency of salsa he is combined at night with melatonin , which  seems to have complimented the Klonopin. Mr.Goette reports having vivid nightmares, usually being chased or attacked by an animal. He usually moves in bed- but at least once left the bed and tried to close and block the door. He has banged on the pillow , accidentally hitting his wife, who can display the bruises here.  He reported having these dreams and acting out  periods up to 4 times a night, and beginning much more infrequently the year before his heart attack in 2012.  He goes to bed around 11 PM and falls asleep promptly, his bedroom is quiet , dark and cool, he shares the bed with his wife.  By 1.30 or 2 AM he has often the  first display of REM BD . He reported not having vivid nightmares for about 6 month after his heart attack, which could be related to medication changes. He goes to the bathroom 2-3 times , and he has a high fluid intake, not caffeine, no ETOH, never been a smoker. The rise time in the morning is 5.30, he wakes spontaneously. He may sleep until 6 AM on weekends. He does not have a headache, he feels refreshed and restored. He snores, but his wife has not noted any apnea. Snoring is supine the loudest . He has a office with frosted glass window, and gets out of doors for lunch. 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   He has been hyperactive to some degree, always has  nervously moved his legs, tapped his feet.  No falls, no BP variability, no near syncope.   Interval history from 02-17-15. This is a yearly revisit, patient Devin LeitzMark Shakoor last seen in July 2015, patent of Dr Timothy Lassousso.  [name] is followed here for REM behavior disorder we are requesting him to fill  sleep related questionnaires as well as memory or cognitive tests. He endorsed the geriatric depression score at 2 points which is clinically deemed insignificant the Epworth sleepiness score at 7 which is a normal range and the fatigue severity score at 20 points also in normal range. He did excellent with the Montral cognitive assessment test. I prefer to apply this test over her Mini-Mental Status Examination due to the patient's age and level of education. The patient was well able to pass the trail making test, copied a three-dimensional cube image and to the clock face. He was also able to name 3 animals do the serial 7 subtraction the language obstruction and  fluency test and he recalled 3 out of 5 recall words as well as being fully oriented to date time and place. The word fluency was 14 words beginning with the letter F. His score was 28 points out of a possible 30 and she lost 2 points to delayed recall. This is not indicative of significant short-term memory loss.  She reports no tremors no quivering voice, no falls or gait instability. No cogwheeling is noted. He also does not suffer from restless legs. The patient reports that his REM behavior disorder has not affected him significantly over the last 12 months he did have a bout of several nights about 4 months ago. However this has not recurred since and he states that it was several months before that about that he had any REM behavior that his wife had witnessed. He remains of course  unawareof the disorder. He sometimes mumbles. No hitting and no thrashing. He reports that he cannot recall his visit dreams that he used to have before.  Interval history from 01/05/2016. Mr. Man is seen here today after a recent uptake in REM behavior activity. He reports that for a week or so he has been almost nightly engaged and acting out dreams but for the very last week prior to this visit the activity has ceased again. There is stress at work, he is 67 years old and feels unready to retire. He is ready to increase Klonopin.  Interval history 02-07-2017, I had the pleasure of seeing [name] today who again scored entirely normal on a Montral cognitive assessment, endorsed 25 points on the fatigue severity scale and 5 points on the Epworth Sleepiness Scale, and 3 out of 15 points in the geriatric  depression score. REM activity is controlled. Klonopin. He has a lot of stress, describes a burn out. 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    Review of Systems:  Out of a complete 14 system review, the patient complains of only the following symptoms, and all other reviewed systems are  negative. See above .   Social History   Social History  . Marital status: Married    Spouse name: Camelia Eng  . Number of children: 1  . Years of education: Bachelor's   Occupational History  . Not on file.   Social History Main Topics  . Smoking status: Never Smoker  . Smokeless tobacco: Never Used  . Alcohol use No     Comment: rarely  . Drug use: No  . Sexual activity: Not on file   Other Topics Concern  . Not on file   Social History Narrative   Patient is married ([name]) and lives at home with his wife.   Patient has one adult child.   Patient is working full-time.   Patient has a Energy manager  degree.   Patient is right-handed.   Patient drinks one cup of tea  daily.    Family History  Problem Relation Age of Onset  . Cancer Mother        esophagus  . Heart disease Mother   . Hypertension Mother   . Esophageal cancer Mother   . Cancer Father        spinal   . Colon cancer Neg Hx   . Rectal cancer Neg Hx   . Stomach cancer Neg Hx     Past Medical History:  Diagnosis Date  . CAD (coronary artery disease)    a. Inf MI/PCI: 100% pRCA (4.0x22 Integrity BMS->dil to 4.55mm)-->MI complicated by VF and CGS;  b. 04/2012 MV: EF 65%, inf attenuation, no ischemia.  . Colon polyps    adenomatous  . Diverticulosis   . Hyperlipidemia   . Hypertensive heart disease   . RBD (REM behavioral disorder) 02/14/2014    Past Surgical History:  Procedure Laterality Date  . broken wrist    . COLONOSCOPY    . CORONARY STENT PLACEMENT     03/2011  . WISDOM TOOTH EXTRACTION      Current Outpatient Prescriptions  Medication Sig Dispense Refill  . aspirin 81 MG tablet Take 81 mg by mouth daily.    . clonazePAM (KLONOPIN) 1 MG tablet Use a quarter or half tablet at night  for parasomnia therapy. 45 tablet 3  . Melatonin 5 MG TABS Take 2 tablets by mouth at bedtime.    . metoprolol tartrate (LOPRESSOR) 25 MG tablet TAKE 0.5 TABLETS (12.5 MG TOTAL) BY MOUTH 2 (TWO) TIMES DAILY. 30  tablet 7  . Misc Natural Products (TART CHERRY ADVANCED) CAPS Take 1 capsule by mouth daily.    . Multiple Vitamin (MULTIVITAMIN) tablet Take 1 tablet by mouth daily.    . niacin (NIASPAN) 500 MG CR tablet Take 1 tablet (500 mg total) by mouth at bedtime. 90 tablet 1  . rosuvastatin (CRESTOR) 20 MG tablet TAKE 1 TABLET (20 MG TOTAL) BY MOUTH DAILY. 30 tablet 5  . Turmeric 500 MG CAPS Take 1 capsule by mouth daily.    Marland Kitchen venlafaxine XR (EFFEXOR XR) 37.5 MG 24 hr capsule Take 1 capsule (37.5 mg total) by mouth daily with breakfast. 30 capsule 1  . Vitamin D, Ergocalciferol, (DRISDOL) 50000 units CAPS capsule Take 1 capsule (50,000 Units total) by mouth every 7 (seven) days. 12 capsule 0   No current facility-administered medications for this visit.     Allergies as of 02/07/2017  . (No Known Allergies)    Vitals: BP 114/74   Pulse (!) 56   Ht 6' (1.829 m)   Wt 194 lb (88 kg)   BMI 26.31 kg/m  Last Weight:  Wt Readings from Last 1 Encounters:  02/07/17 194 lb (88 kg)   Last Height:   Ht Readings from Last 1 Encounters:  02/07/17 6' (1.829 m)    Physical exam:  General: The patient is awake, alert and appears not in acute distress. The patient is well groomed. Head: Normocephalic, atraumatic. Neck is supple. Mallampati 4  neck circumference: 15.5  Inches . He has TMJ click on the left, no delayed swallowing.  Cardiovascular:  Regular rate and rhythm ,  without  murmurs or carotid bruit, and without distended neck veins. Respiratory: Lungs are clear to auscultation. Skin:  Without evidence of edema, or rash Trunk:  Patient has normal posture.  Neurologic exam : The patient is  awake and alert, oriented to place and time. Memory : performed 2016 a  MOCA with patient , who lost 2 of 5 words in recall. No repeat in 2017. Montreal Cognitive Assessment  02/07/2017  Visuospatial/ Executive (0/5) 5  Naming (0/3) 3  Attention: Read list of digits (0/2) 2  Attention: Read list of  letters (0/1) 1  Attention: Serial 7 subtraction starting at 100 (0/3) 3  Language: Repeat phrase (0/2) 2  Language : Fluency (0/1) 1  Abstraction (0/2) 2  Delayed Recall (0/5) 2  Orientation (0/6) 5  Total 26     There is a normal attention span & concentration ability. He became tearful when he described his work situation.  Speech is fluent without  dysarthria, dysphonia or aphasia.  Mood and affect are appropriate.  Cranial nerves: Pupils are equal and briskly reactive to light. Funduscopic exam without  evidence of pallor or edema.   Floaters  In the right lens. Extraocular movements  in vertical and horizontal planes intact and without nystagmus. Visual fields by finger perimetry are intact. Hearing to finger rub intact.  Facial sensation intact to fine touch. Facial motor strength is symmetric and tongue and uvula move midline. Motor exam:  Normal tone , muscle bulk and symmetric normal strength in all extremities.  He has no longer cogwheel rigidity over the left biceps, not the wrist not the shoulder, this could related to a C5 radiculopathy.  Sensory:  No deficits. Coordination: Finger-to-nose maneuvernormal without evidence of ataxia, but there was mild dysmetria,  there was a mild action tremor in the right. Gait and station: Patient walks without assistive device, steady and with normal armswing.  Strength within normal limits. Stance is stable and normal.  Tandem gait is intact, turns with 2 Steps, which  are unfragmented. Romberg testing is normal. Deep tendon reflexes: in the  upper and lower extremities are symmetric and intact.   Assessment:  After physical and neurologic examination, review of laboratory studies, imaging, neurophysiology testing and pre-existing records, assessment is :  REM BD  With nocturnal  kicking and nightmares. Confirmed REM BD in sleep study,  On betablocker- helps  Tremor, anxiety.  No evidence of MCI.   Plan:  Klonopin 0.5 mg nightly,  prn twice at night. . Continue medication, yearly RV for REM BD with MOCA.     Melvyn Novas, MD

## 2017-02-08 ENCOUNTER — Encounter: Payer: Self-pay | Admitting: Family Medicine

## 2017-02-08 ENCOUNTER — Ambulatory Visit (INDEPENDENT_AMBULATORY_CARE_PROVIDER_SITE_OTHER): Payer: BLUE CROSS/BLUE SHIELD | Admitting: Family Medicine

## 2017-02-08 VITALS — BP 120/70 | HR 51 | Ht 72.0 in | Wt 198.0 lb

## 2017-02-08 DIAGNOSIS — M5416 Radiculopathy, lumbar region: Secondary | ICD-10-CM

## 2017-02-08 DIAGNOSIS — IMO0001 Reserved for inherently not codable concepts without codable children: Secondary | ICD-10-CM

## 2017-02-08 MED ORDER — CILOSTAZOL 50 MG PO TABS: 25.0000 mg | ORAL_TABLET | Freq: Every day | ORAL | 1 refills | Status: DC

## 2017-02-08 MED ORDER — ALBUTEROL SULFATE HFA 108 (90 BASE) MCG/ACT IN AERS: INHALATION_SPRAY | RESPIRATORY_TRACT | 6 refills | Status: DC

## 2017-02-08 NOTE — Patient Instructions (Addendum)
Good to see you  Pletal daily lets start low at 25mg   We will get EMG as well.  Albuterol 1 puff 30 min before exercises  MAke an appointment in 3-4 weeks.

## 2017-02-08 NOTE — Progress Notes (Signed)
[name] D.O. [facility] [address] [place], [place] [address] Phone: [phone] Subjective:    I'm seeing this patient by the request  of:  [name], [name], MD   CC: Right hip   AOZ:[id number]:Subjective  [name] is a 66 y.o. male coming in with complaint of right hip pain. Patient has had some pain for quite some time. Patient has seen a plantar fasciitis. Patient has had significant workup over the course of 1-2 years. Patient did have an MRI that was independently visualized by me. MRI of the lumbar spine did show severe degenerative disc disease at L3-L4 significant nerve root impingement on the left sign. Patient though symptoms as well as been on the right side. Patient states that he has had multiple epidurals and did respond to the first one medicine's and has not Since then. Patient is also had an piriformis injection that was somewhat helpful previously. Patient continued having radicular symptoms down the right side was started on Effexor. Patient states the medication did not seem to be helpful. Continues to have difficult he. Patient states that some days he can make it a mile without it difficult he sometimes he can only make a 200 feet before he starts having difficulty. Unable to run. Still only able to do daily activities at this time. Very frustrating.    Past Medical History:  Diagnosis Date  . CAD (coronary artery disease)    a. Inf MI/PCI: 100% pRCA (4.0x22 Integrity BMS->dil to 4.3038mm)-->MI complicated by VF and CGS;  b. 04/2012 MV: EF 65%, inf attenuation, no ischemia.  . Colon polyps    adenomatous  . Diverticulosis   . Hyperlipidemia   . Hypertensive heart disease   . RBD (REM behavioral disorder) 02/14/2014   Past Surgical History:  Procedure Laterality Date  . broken wrist    . COLONOSCOPY    . CORONARY STENT PLACEMENT     03/2011  . WISDOM TOOTH EXTRACTION     Social History   Social History  . Marital status: Married    Spouse name: [name]    . Number of children: 1  . Years of education: Bachelor's   Social History Main Topics  . Smoking status: Never Smoker  . Smokeless tobacco: Never Used  . Alcohol use No     Comment: rarely  . Drug use: No  . Sexual activity: Not Asked   Other Topics Concern  . None   Social History Narrative   Patient is married ([name]) and lives at home with his wife.   Patient has one adult child.   Patient is working full-time.   Patient has a Bachelor's degree.   Patient is right-handed.   Patient drinks one cup of tea  daily.   No Known Allergies Family History  Problem Relation Age of Onset  . Cancer Mother        esophagus  . Heart disease Mother   . Hypertension Mother   . Esophageal cancer Mother   . Cancer Father        spinal   . Colon cancer Neg Hx   . Rectal cancer Neg Hx   . Stomach cancer Neg Hx     Past medical history, social, surgical and family history all reviewed in electronic medical record.  No pertanent information unless stated regarding to the chief complaint.   Review of Systems: No headache, visual changes, nausea, vomiting, diarrhea, constipation, dizziness, abdominal pain, skin rash, fevers, chills, night sweats, weight  loss, swollen lymph nodes, body aches, joint swelling, chest pain, shortness of breath, mood changes.  Positive muscle aches  Objective  Blood pressure 120/70, pulse (!) 51, height 6' (1.829 m), weight 198 lb (89.8 kg), SpO2 97 %.   Systems examined below as of 02/08/17 General: NAD A&O x3 mood, affect normal  HEENT: Pupils equal, extraocular movements intact no nystagmus Respiratory: not short of breath at rest or with speaking Cardiovascular: No lower extremity edema, non tender Skin: Warm dry intact with no signs of infection or rash on extremities or on axial skeleton. Abdomen: Soft nontender, no masses Neuro: Cranial nerves  intact, neurovascularly intact in all extremities with 2+ DTRs and 2+ pulses. Lymph: No lymphadenopathy  appreciated today  Gait normal with good balance and coordination.  MSK: Non tender with full range of motion and good stability and symmetric strength and tone of shoulders, elbows, wrist,  knee hips and ankles bilaterally.   Back Exam:  Inspection: Unremarkable  Motion: Flexion 45 deg, Extension 25 deg, Side Bending to 45 deg bilaterally,  Rotation to 45 deg bilaterally  SLR laying: Negative  XSLR laying: Negative  Palpable tenderness: Mild tenderness to palpation in the paraspinal musculature on the right side in the right piriformis. FABER: Mild positive right. Sensory change: Gross sensation intact to all lumbar and sacral dermatomes.  Reflexes: 2+ at both patellar tendons, 2+ at achilles tendons, Babinski's downgoing.  Strength at foot  Plantar-flexion: 5/5 Dorsi-flexion: 5/5 Eversion: 5/5 Inversion: 5/5  Leg strength  Quad: 5/5 Hamstring: 5/5 Hip flexor: 5/5 Hip abductors: 5/5  Gait unremarkable.      Impression and Recommendations:     This case required medical decision making of moderate complexity.      Note: This dictation was prepared with Dragon dictation along with smaller phrase technology. Any transcriptional errors that result from this process are unintentional.

## 2017-02-08 NOTE — Assessment & Plan Note (Signed)
No significant improvement with the Effexor. Discontinued that. Patient has tried gabapentin with no significant benefit either. We discussed icing regimen. We'll try Pletal and a low dose. Warned of potential side effects. In addition of this we discussed possiblybeing more pulmonary in nature. Patient will try all of a inhaler to see if this will be any type of improvement. We are still awaiting patient's EMG that would further evaluate if we should consider doing more epidurals versus possibly nerve root injections are piriformis injections. Following up again in 4 weeks.

## 2017-02-28 ENCOUNTER — Ambulatory Visit (INDEPENDENT_AMBULATORY_CARE_PROVIDER_SITE_OTHER): Payer: BLUE CROSS/BLUE SHIELD | Admitting: Neurology

## 2017-02-28 ENCOUNTER — Encounter: Payer: Self-pay | Admitting: Neurology

## 2017-02-28 ENCOUNTER — Ambulatory Visit (INDEPENDENT_AMBULATORY_CARE_PROVIDER_SITE_OTHER): Payer: Self-pay | Admitting: Neurology

## 2017-02-28 DIAGNOSIS — M25551 Pain in right hip: Secondary | ICD-10-CM | POA: Diagnosis not present

## 2017-02-28 DIAGNOSIS — IMO0001 Reserved for inherently not codable concepts without codable children: Secondary | ICD-10-CM

## 2017-02-28 DIAGNOSIS — M5416 Radiculopathy, lumbar region: Secondary | ICD-10-CM

## 2017-02-28 NOTE — Procedures (Signed)
     HISTORY:  Devin LeitzMark Watkins is a 66 year old gentleman with a two-year history of right hip and buttock discomfort, with discomfort and paresthesias going down to the foot with walking, better with rest. The patient being evaluated for possible piriformis syndrome.  NERVE CONDUCTION STUDIES:  Nerve conduction studies were performed on both lower extremities. The distal motor latencies for the peroneal nerves were prolonged on the left and normal on the right with low motor amplitudes for these nerves bilaterally. The nerve conduction velocities for the peroneal nerves were normal bilaterally. The distal motor latencies for the posterior tibial nerves were normal bilaterally with normal motor amplitudes seen for these nerves. Normal nerve conduction velocities were noted for the posterior tibial nerves bilaterally. The peroneal sensory latencies were within normal limits bilaterally. The H reflex latencies were prolonged bilaterally.  EMG STUDIES:  EMG study was performed on the right lower extremity:  The tibialis anterior muscle reveals 2 to 5K motor units with decreased recruitment. No fibrillations or positive waves were seen. The peroneus tertius muscle reveals 2 to 5K motor units with markedly decreased recruitment. No fibrillations or positive waves were seen. The medial gastrocnemius muscle reveals 1 to 4K motor units with slightly decreased recruitment. No fibrillations or positive waves were seen. The vastus lateralis muscle reveals 2 to 4K motor units with full recruitment. No fibrillations or positive waves were seen. The iliopsoas muscle reveals 2 to 4K motor units with full recruitment. No fibrillations or positive waves were seen. The biceps femoris muscle (long head) reveals 2 to 4K motor units with full recruitment. No fibrillations or positive waves were seen. The lumbosacral paraspinal muscles were tested at 3 levels, and revealed no abnormalities of insertional activity at all  3 levels tested. There was good relaxation.   IMPRESSION:  Nerve conduction studies done on both lower extremities shows evidence of primarily motor dysfunction of the peroneal nerves bilaterally with sensory sparing. EMG evaluation of the right lower extremity shows chronic stable denervation in the right peroneal nerve distribution, minimal chronic stable signs of denervation the gastrocnemius muscle as well. The possibility of overlying chronic sciatic nerve injury is possible, and overlying L5 or S1 radiculopathy cannot be confirmed on this study.  [name]. Devin Aashir Umholtz MD 02/28/2017 9:26 AM  [facility] [address] [place], [place] [id number]  Phone [phone] Fax [phone]

## 2017-02-28 NOTE — Progress Notes (Signed)
Please refer EMG and nerve conduction study procedure note. 

## 2017-03-01 ENCOUNTER — Encounter: Payer: Self-pay | Admitting: Family Medicine

## 2017-03-01 DIAGNOSIS — IMO0001 Reserved for inherently not codable concepts without codable children: Secondary | ICD-10-CM

## 2017-03-01 NOTE — Progress Notes (Signed)
    [facility]    Nerve / Sites Muscle Latency Ref. Amplitude Ref. Rel Amp Segments Distance Velocity Ref. Area    ms ms mV mV %  cm m/s m/s mVms  L Peroneal - EDB     Ankle EDB 6.7 ?6.5 0.8 ?2.0 100 Ankle - EDB 9   1.8     Fib head EDB 15.4  0.5  61.5 Fib head - Ankle 42 49 ?44 1.9     Pop fossa EDB 17.1  0.6  119 Pop fossa - Fib head 10 58 ?44 1.1         Pop fossa - Ankle      R Peroneal - EDB     Ankle EDB 6.0 ?6.5 1.1 ?2.0 100 Ankle - EDB 9   2.7     Fib head EDB 14.7  0.9  88.8 Fib head - Ankle 42 48 ?44 2.0     Pop fossa EDB 16.5  1.5  162 Pop fossa - Fib head 10 58 ?44 4.0         Pop fossa - Ankle      L Tibial - AH     Ankle AH 4.7 ?5.8 7.7 ?4.0 100 Ankle - AH 9   15.5     Pop fossa AH 15.6  7.5  97.8 Pop fossa - Ankle 48 44 ?41 19.0  R Tibial - AH     Ankle AH 5.4 ?5.8 9.0 ?4.0 100 Ankle - AH 9   21.3     Pop fossa AH 16.1  6.1  67.9 Pop fossa - Ankle 47 44 ?41 17.6             SNC    Nerve / Sites Rec. Site Peak Lat Ref.  Amp Ref. Segments Distance    ms ms V V  cm  R Superficial peroneal - Ankle     Lat leg Ankle 3.4 ?4.4 3 ?6 Lat leg - Ankle 14  L Superficial peroneal - Ankle     Lat leg Ankle 3.0 ?4.4 3 ?6 Lat leg - Ankle 14         H Reflex    Nerve H Lat Lat Hmax   ms ms   Left Right Ref. Left Right Ref.  Tibial - Soleus 35.8 37.5 ?35.0 40.2 39.2 ?35.0

## 2017-03-06 ENCOUNTER — Telehealth: Payer: Self-pay | Admitting: Family Medicine

## 2017-03-06 NOTE — Telephone Encounter (Signed)
Sent him note in EMR It is coming from his back.  Discussed possible epidural if he would like to try, think it could be helpful.  B12 daily with B6 200mg  daily

## 2017-03-06 NOTE — Telephone Encounter (Signed)
Pt would like a call back with his results from 8/14 EMG

## 2017-03-07 ENCOUNTER — Telehealth: Payer: Self-pay | Admitting: Family Medicine

## 2017-03-07 NOTE — Telephone Encounter (Signed)
Pt understood.

## 2017-03-07 NOTE — Telephone Encounter (Addendum)
Patient has called in stating that Dr. Katrinka Blazing has scheduled him for an epidural.  States that he currently is in a lot of pain and would like to know what Dr. Katrinka Blazing suggest him to do until he gets the epidural.  Patient does states some is probably self inflected b/c they are in the process of moving.  Patient also states if something is prescribed to take he is concerned about interactions with heart medication he currently takes.  Please follow up with patient in regard.  [facility] uses [facility] in [place].

## 2017-03-08 ENCOUNTER — Encounter: Payer: Self-pay | Admitting: Family Medicine

## 2017-03-08 MED ORDER — GABAPENTIN 300 MG PO CAPS: 300.0000 mg | ORAL_CAPSULE | Freq: Every day | ORAL | 0 refills | Status: DC

## 2017-03-08 MED ORDER — TRAMADOL HCL 50 MG PO TABS: 50.0000 mg | ORAL_TABLET | Freq: Three times a day (TID) | ORAL | 0 refills | Status: DC | PRN

## 2017-03-08 NOTE — Telephone Encounter (Signed)
I think nerve medications such as gabapentin, effexor or cymbalta would be most helpful and would send one in if he would like.  Would recommend gabapentin 300mg  at night

## 2017-03-08 NOTE — Telephone Encounter (Signed)
Discussed with pt. He states he has taken gabapentin 200mg  in the past & does not remember making a difference. Pt states he will try 300mg  & if no improvement will call back.  rx sent to pharmacy.

## 2017-03-10 ENCOUNTER — Emergency Department (HOSPITAL_BASED_OUTPATIENT_CLINIC_OR_DEPARTMENT_OTHER)
Admission: EM | Admit: 2017-03-10 | Discharge: 2017-03-10 | Disposition: A | Discharge status: Home / Self Care | Payer: BLUE CROSS/BLUE SHIELD | Attending: Emergency Medicine | Admitting: Emergency Medicine

## 2017-03-10 ENCOUNTER — Encounter (HOSPITAL_BASED_OUTPATIENT_CLINIC_OR_DEPARTMENT_OTHER): Payer: Self-pay | Admitting: *Deleted

## 2017-03-10 DIAGNOSIS — W268XXA Contact with other sharp object(s), not elsewhere classified, initial encounter: Secondary | ICD-10-CM | POA: Insufficient documentation

## 2017-03-10 DIAGNOSIS — I2510 Atherosclerotic heart disease of native coronary artery without angina pectoris: Secondary | ICD-10-CM | POA: Insufficient documentation

## 2017-03-10 DIAGNOSIS — S56922A Laceration of unspecified muscles, fascia and tendons at forearm level, left arm, initial encounter: Secondary | ICD-10-CM | POA: Insufficient documentation

## 2017-03-10 DIAGNOSIS — S51812A Laceration without foreign body of left forearm, initial encounter: Secondary | ICD-10-CM

## 2017-03-10 DIAGNOSIS — Z7902 Long term (current) use of antithrombotics/antiplatelets: Secondary | ICD-10-CM | POA: Diagnosis not present

## 2017-03-10 DIAGNOSIS — Z955 Presence of coronary angioplasty implant and graft: Secondary | ICD-10-CM | POA: Insufficient documentation

## 2017-03-10 DIAGNOSIS — Y998 Other external cause status: Secondary | ICD-10-CM | POA: Diagnosis not present

## 2017-03-10 DIAGNOSIS — Y9301 Activity, walking, marching and hiking: Secondary | ICD-10-CM | POA: Diagnosis not present

## 2017-03-10 DIAGNOSIS — I119 Hypertensive heart disease without heart failure: Secondary | ICD-10-CM | POA: Insufficient documentation

## 2017-03-10 DIAGNOSIS — Z23 Encounter for immunization: Secondary | ICD-10-CM | POA: Diagnosis not present

## 2017-03-10 DIAGNOSIS — Z79899 Other long term (current) drug therapy: Secondary | ICD-10-CM | POA: Diagnosis not present

## 2017-03-10 DIAGNOSIS — Y929 Unspecified place or not applicable: Secondary | ICD-10-CM | POA: Insufficient documentation

## 2017-03-10 DIAGNOSIS — Z7982 Long term (current) use of aspirin: Secondary | ICD-10-CM | POA: Insufficient documentation

## 2017-03-10 DIAGNOSIS — I252 Old myocardial infarction: Secondary | ICD-10-CM | POA: Diagnosis not present

## 2017-03-10 MED ORDER — TETANUS-DIPHTH-ACELL PERTUSSIS 5-2.5-18.5 LF-MCG/0.5 IM SUSP
0.5000 mL | Freq: Once | INTRAMUSCULAR | Status: AC
  Administered 2017-03-10: 0.5 mL via INTRAMUSCULAR
  Filled 2017-03-10: qty 0.5

## 2017-03-10 MED ORDER — LIDOCAINE-EPINEPHRINE (PF) 2 %-1:200000 IJ SOLN
10.0000 mL | Freq: Once | INTRAMUSCULAR | Status: AC
  Administered 2017-03-10: 10 mL via INTRADERMAL
  Filled 2017-03-10: qty 10

## 2017-03-10 NOTE — ED Notes (Signed)
ED Provider at bedside. 

## 2017-03-10 NOTE — Discharge Instructions (Signed)
Please follow up with your doctor in 7 days for sutures removal.  Take tramadol previously prescribed as needed for pain.  Return if you have any concern.  You do have a partial tendon injury, if you develop difficulty moving any part of your forearm or increasing pain, please follow up with hand specialist Dr. Izora Ribas.

## 2017-03-10 NOTE — ED Notes (Signed)
Bleeding controlled.

## 2017-03-10 NOTE — ED Provider Notes (Signed)
[facility]   CSN: [id number] Arrival date & time: 03/10/17  0932     History   Chief Complaint No chief complaint on file.   HPI [name] is a 66 y.o. male.  HPI   66 year old male presenting for evaluation of arm laceration.Patient states approximately one hour ago he was carrying a large mirror up the steps when the mirror caught the edge of the step, and shattered cutting his left forearm. He noticed immediate gush of blood and call EMS; patient was brought here for further evaluation. Currently he only complaining of mild sharp throbbing, non radiating pain to the affected area. Aside from a pressure dressing, no other specific treatment tried. He denies any associated numbness. He denies any other injury. He cannot recall his last tetanus. He is right hand dominant.  Past Medical History:  Diagnosis Date  . CAD (coronary artery disease)    a. Inf MI/PCI: 100% pRCA (4.0x22 Integrity BMS->dil to 4.68mm)-->MI complicated by VF and CGS;  b. 04/2012 MV: EF 65%, inf attenuation, no ischemia.  . Colon polyps    adenomatous  . Diverticulosis   . Hyperlipidemia   . Hypertensive heart disease   . RBD (REM behavioral disorder) 02/14/2014    Patient Active Problem List   Diagnosis Date Noted  . Tremor 02/07/2017  . Anxiety as acute reaction to exceptional stress 02/07/2017  . Radicular pain of right lower back 01/11/2017  . Vivid dream 01/05/2016  . Controlled REM sleep behavior disorder 01/05/2016  . CAD (coronary artery disease)   . Hypertensive heart disease   . History of colonic polyps 03/26/2015  . Bradycardia 11/25/2014  . RBD (REM behavioral disorder) 02/14/2014  . Old MI (myocardial infarction) 04/25/2013  . Hyperlipidemia with target LDL less than 70 04/25/2013    Past Surgical History:  Procedure Laterality Date  . broken wrist    . COLONOSCOPY    . CORONARY STENT PLACEMENT     03/2011  . WISDOM TOOTH EXTRACTION         Home  Medications    Prior to Admission medications   Medication Sig Start Date End Date Taking? Authorizing Provider  albuterol (PROVENTIL HFA;VENTOLIN HFA) 108 (90 Base) MCG/ACT inhaler 30 minutes before exercise use 1 puff inhaled. 02/08/17   Judi Saa, DO  aspirin 81 MG tablet Take 81 mg by mouth daily.    [provider]  cilostazol (PLETAL) 50 MG tablet Take 0.5 tablets (25 mg total) by mouth daily. 02/08/17   Judi Saa, DO  clonazePAM (KLONOPIN) 1 MG tablet Use a quarter or half tablet at night  for parasomnia therapy. 02/07/17   Dohmeier, Porfirio Mylar, MD  gabapentin (NEURONTIN) 300 MG capsule Take 1 capsule (300 mg total) by mouth at bedtime. 03/08/17   Judi Saa, DO  Melatonin 5 MG TABS Take 2 tablets by mouth at bedtime.    [provider]  metoprolol tartrate (LOPRESSOR) 25 MG tablet TAKE 0.5 TABLETS (12.5 MG TOTAL) BY MOUTH 2 (TWO) TIMES DAILY. 10/10/16   Lennette Bihari, MD  Misc Natural Products [facility] ADVANCED) CAPS Take 1 capsule by mouth daily.    [provider]  Multiple Vitamin (MULTIVITAMIN) tablet Take 1 tablet by mouth daily.    [provider]  niacin (NIASPAN) 500 MG CR tablet Take 1 tablet (500 mg total) by mouth at bedtime. 01/26/17   Lennette Bihari, MD  rosuvastatin (CRESTOR) 20 MG tablet TAKE 1 TABLET (20  MG TOTAL) BY MOUTH DAILY. 12/23/16   Lennette Bihari, MD  traMADol (ULTRAM) 50 MG tablet Take 1 tablet (50 mg total) by mouth every 8 (eight) hours as needed. 03/08/17   Judi Saa, DO  Turmeric 500 MG CAPS Take 1 capsule by mouth daily.    [provider]  Vitamin D, Ergocalciferol, (DRISDOL) 50000 units CAPS capsule Take 1 capsule (50,000 Units total) by mouth every 7 (seven) days. 01/11/17   Judi Saa, DO    Family History Family History  Problem Relation Age of Onset  . Cancer Mother        esophagus  . Heart disease Mother   . Hypertension Mother   . Esophageal cancer Mother   . Cancer  Father        spinal   . Colon cancer Neg Hx   . Rectal cancer Neg Hx   . Stomach cancer Neg Hx     Social History Social History  Substance Use Topics  . Smoking status: Never Smoker  . Smokeless tobacco: Never Used  . Alcohol use No     Comment: rarely     Allergies   Patient has no known allergies.   Review of Systems Review of Systems  Constitutional: Negative for fever.  Skin: Positive for wound.  Neurological: Negative for numbness.     Physical Exam Updated Vital Signs BP 131/70 (BP Location: Left Arm)   Pulse 60   Temp 98.7 F (37.1 C) (Oral)   Resp 18   Ht 6' (1.829 m)   Wt 88.5 kg (195 lb)   SpO2 98%   BMI 26.45 kg/m   Physical Exam  Constitutional: He appears well-developed and well-nourished. No distress.  HENT:  Head: Atraumatic.  Eyes: Conjunctivae are normal.  Neck: Neck supple.  Cardiovascular: Intact distal pulses.   Musculoskeletal: He exhibits tenderness (Left forearm: A 3 cm horizontal laceration noted to the mid forearm dorsally without a foreign body noted. Sensation is intact distally. Normal grip strengthl).  Neurological: He is alert.  Skin: No rash noted.  Psychiatric: He has a normal mood and affect.  Nursing note and vitals reviewed.    ED Treatments / Results  Labs (all labs ordered are listed, but only abnormal results are displayed) Labs Reviewed - No data to display  EKG  EKG Interpretation None       Radiology No results found.  Procedures .Marland KitchenLaceration Repair Date/Time: 03/10/2017 10:37 AM Performed by: Fayrene Helper Authorized by: Fayrene Helper   Consent:    Consent obtained:  Verbal   Consent given by:  Patient   Risks discussed:  Pain, poor wound healing and poor cosmetic result   Alternatives discussed:  No treatment and delayed treatment Anesthesia (see MAR for exact dosages):    Anesthesia method:  Local infiltration   Local anesthetic:  Lidocaine 2% WITH epi Laceration details:    Location:   Shoulder/arm   Shoulder/arm location:  L lower arm   Length (cm):  3   Depth (mm):  5 Repair type:    Repair type:  Simple Pre-procedure details:    Preparation:  Patient was prepped and draped in usual sterile fashion Exploration:    Hemostasis achieved with:  Direct pressure   Wound exploration: wound explored through full range of motion and entire depth of wound probed and visualized     Wound extent: fascia violated and tendon damage     Wound extent: no nerve damage noted and no vascular  damage noted     Tendon damage location:  Upper extremity   Upper extremity tendon damage location:  Forearm flexor   Tendon damage extent:  Partial transection   Contaminated: no   Treatment:    Area cleansed with:  Betadine   Amount of cleaning:  Standard   Irrigation solution:  Sterile saline   Irrigation volume:  200   Irrigation method:  Pressure wash   Visualized foreign bodies/material removed: no   Skin repair:    Repair method:  Sutures   Suture size:  5-0   Suture material:  Prolene   Suture technique:  Simple interrupted   Number of sutures:  6 Approximation:    Approximation:  Close   Vermilion border: well-aligned   Post-procedure details:    Dressing:  Adhesive bandage   Patient tolerance of procedure:  Tolerated well, no immediate complications   (including critical care time)  Medications Ordered in ED Medications  Tdap (BOOSTRIX) injection 0.5 mL (0.5 mLs Intramuscular Given 03/10/17 1018)  lidocaine-EPINEPHrine (XYLOCAINE W/EPI) 2 %-1:200000 (PF) injection 10 mL (10 mLs Intradermal Given 03/10/17 1019)     Initial Impression / Assessment and Plan / ED Course  I have reviewed the triage vital signs and the nursing notes.  Pertinent labs & imaging results that were available during my care of the patient were reviewed by me and considered in my medical decision making (see chart for details).     BP 131/70 (BP Location: Left Arm)   Pulse 60   Temp 98.7 F  (37.1 C) (Oral)   Resp 18   Ht 6' (1.829 m)   Wt 88.5 kg (195 lb)   SpO2 98%   BMI 26.45 kg/m    Final Clinical Impressions(s) / ED Diagnoses   Final diagnoses:  Forearm laceration involving tendon, left, initial encounter    New Prescriptions New Prescriptions   No medications on file   10:10 AM Patient presents for a laceration to his forearm. He is neurovascularly intact. Will update his tetanus and will perform suture repair. No other injury noted.  10:43 AM Successful suture repair.  Pt able to move all part of forearm and hand distal to the injury.  He does have a partial tendon injury, pt made aware to f/u with hand if sxs worsen.  Sutures removal in 7 days.    Fayrene Helper, PA-C 03/10/17 1044    Tilden Fossa, MD 03/11/17 [phone]

## 2017-03-10 NOTE — ED Notes (Signed)
Suture Cart outside room.

## 2017-03-10 NOTE — ED Triage Notes (Signed)
Patient was carrying a mirror and it broke, cutting his left forearm.

## 2017-03-21 ENCOUNTER — Ambulatory Visit
Admission: RE | Admit: 2017-03-21 | Discharge: 2017-03-21 | Disposition: A | Discharge status: Home / Self Care | Payer: BLUE CROSS/BLUE SHIELD | Source: Ambulatory Visit | Attending: Family Medicine | Admitting: Family Medicine

## 2017-03-21 DIAGNOSIS — IMO0001 Reserved for inherently not codable concepts without codable children: Secondary | ICD-10-CM

## 2017-03-21 MED ORDER — IOPAMIDOL (ISOVUE-M 200) INJECTION 41%
1.0000 mL | Freq: Once | INTRAMUSCULAR | Status: AC
  Administered 2017-03-21: 1 mL via EPIDURAL

## 2017-03-21 MED ORDER — METHYLPREDNISOLONE ACETATE 40 MG/ML INJ SUSP (RADIOLOG
120.0000 mg | Freq: Once | INTRAMUSCULAR | Status: AC
  Administered 2017-03-21: 120 mg via EPIDURAL

## 2017-03-21 NOTE — Discharge Instructions (Signed)

## 2017-04-01 ENCOUNTER — Other Ambulatory Visit: Payer: Self-pay | Admitting: Neurology

## 2017-04-01 ENCOUNTER — Other Ambulatory Visit: Payer: Self-pay | Admitting: Family Medicine

## 2017-04-03 NOTE — Telephone Encounter (Signed)
Refill done.  

## 2017-04-04 ENCOUNTER — Encounter: Payer: Self-pay | Admitting: Family Medicine

## 2017-04-04 ENCOUNTER — Encounter: Payer: Self-pay | Admitting: Neurology

## 2017-04-04 DIAGNOSIS — IMO0001 Reserved for inherently not codable concepts without codable children: Secondary | ICD-10-CM

## 2017-04-05 ENCOUNTER — Telehealth: Payer: Self-pay | Admitting: Neurology

## 2017-04-05 MED ORDER — CLONAZEPAM 0.5 MG PO TABS: 0.7500 mg | ORAL_TABLET | Freq: Every day | ORAL | 1 refills | Status: DC

## 2017-04-05 NOTE — Telephone Encounter (Signed)
I am so sorry- I correct this immediately .   CD ===View-only below this line===   ----- Message -----    From: [name]    Sent: 04/04/2017 10:10 AM EDT      To: Melvyn Novas, MD Subject: Non-Urgent Medical Question  [facility] but it isn't correct.  I am currently taking .  per night (I have to cut the pill in half and then again in quarters).  They prescription they have is for me to take .  to .  each night.  So they only filled 45 pills and said it was a 90 day supply which is incorrect.  What do we need to do so I can get them an updated prescription?  I will run out of tablets by Wednesday.

## 2017-04-27 ENCOUNTER — Ambulatory Visit
Admission: RE | Admit: 2017-04-27 | Discharge: 2017-04-27 | Disposition: A | Discharge status: Home / Self Care | Payer: BLUE CROSS/BLUE SHIELD | Source: Ambulatory Visit | Attending: Family Medicine | Admitting: Family Medicine

## 2017-04-27 DIAGNOSIS — IMO0001 Reserved for inherently not codable concepts without codable children: Secondary | ICD-10-CM

## 2017-04-27 MED ORDER — METHYLPREDNISOLONE ACETATE 40 MG/ML INJ SUSP (RADIOLOG
120.0000 mg | Freq: Once | INTRAMUSCULAR | Status: AC
  Administered 2017-04-27: 120 mg via EPIDURAL

## 2017-04-27 MED ORDER — IOPAMIDOL (ISOVUE-M 200) INJECTION 41%
1.0000 mL | Freq: Once | INTRAMUSCULAR | Status: AC
  Administered 2017-04-27: 1 mL via EPIDURAL

## 2017-05-06 ENCOUNTER — Other Ambulatory Visit: Payer: Self-pay | Admitting: Cardiovascular Disease

## 2017-05-27 ENCOUNTER — Other Ambulatory Visit: Payer: Self-pay | Admitting: Family Medicine

## 2017-06-02 ENCOUNTER — Other Ambulatory Visit: Payer: Self-pay | Admitting: Family Medicine

## 2017-06-02 NOTE — Telephone Encounter (Signed)
Refill done.  

## 2017-06-07 ENCOUNTER — Encounter: Payer: Self-pay | Admitting: Cardiovascular Disease

## 2017-06-07 ENCOUNTER — Ambulatory Visit: Payer: BLUE CROSS/BLUE SHIELD | Admitting: Cardiovascular Disease

## 2017-06-07 VITALS — BP 118/72 | HR 59 | Ht 72.0 in | Wt 199.2 lb

## 2017-06-07 DIAGNOSIS — I252 Old myocardial infarction: Secondary | ICD-10-CM

## 2017-06-07 DIAGNOSIS — I2510 Atherosclerotic heart disease of native coronary artery without angina pectoris: Secondary | ICD-10-CM

## 2017-06-07 DIAGNOSIS — Z79899 Other long term (current) drug therapy: Secondary | ICD-10-CM

## 2017-06-07 DIAGNOSIS — E785 Hyperlipidemia, unspecified: Secondary | ICD-10-CM | POA: Diagnosis not present

## 2017-06-07 DIAGNOSIS — G4752 REM sleep behavior disorder: Secondary | ICD-10-CM

## 2017-06-07 NOTE — Progress Notes (Signed)
Patient ID: Nyal Schachter, male   DOB: Nov 07, 1950, 66 y.o.   MRN: [id number]        HPI: Basem Yannuzzi is a 66 y.o. male who presents for a 42 month cardiology evaluation.  Mr. Trella developed an acute coronary syndrome/ST segment elevation inferior wall myocardial infarction on 04/16/2011. At that time, he had never had any cardiac symptoms. Immediately upon arrival to the catheterization laboratory he developed recurrent episodes of ventricular fibrillation and required multiple defibrillations. At catheterization he was found to have total proximal RCA occlusion in a very dominant vessel. He was in cardiogenic shock initially had significant for wall motion abnormality. I performed successful intervention and ultimately placed a 4.0x22 mm integrity stent postdilated to 4.38 mm in a large proximal right coronary artery. Ejection fraction ultimately improved to approximately 50-55%. A nuclear perfusion study in October 2013 showed essentially complete salvage of myocardium. He was on Brilinta for approximately 15 months and ultimately this was discontinued and he has been on aspirin alone. Also has been on combination therapy with low-dose Niaspan Crestor in addition to low-dose beta blocker therapy.  Since I last saw him he has remained cardiac stable.  He denies recurrent chest pain.  There are no palpitations.  He denies any PND or orthopnea.  He is unaware of any episodes of tachycardia.  He denies presyncope or syncope.  He does admit to increased work related stress.  However, he is managing to exercise 5 days per week.  He sees Dr. Virgina Jock, for primary care .  Laboratory has been checked by Dr. Virgina Jock.  He  was diagnosed as having insomnia with a REM sleep behavior disorder associated with  vivid frightful dreams, thrashing with viscious animals.  He has seen Dr. Roddie Mc and was started on Klonopin with some improvement in his symptomatology.  When he was last seen by her, Klonopin dose was increased.   He now takes 1.5 mg at bedtime.  He underwent a four-year follow-up nuclear perfusion study on 11/24/2015, which remained normal.  There was mild inferolateral attenuation but no evidence for prior infarction or ischemia.  Ejection fraction was 50%.    Since I saw him in November 2017, he has remained stable from a cardiac standpoint.  He has had difficulty with right sciatica resulting from L4, L5 degenerative disc disease which was confirmed on MRI.  He has undergone 2 spinal injections over the past several months.  He is still working.  Denies any episodes of chest pain, PND, orthopnea.  He denies change in exercise tolerance.  He will be having laboratory from Dr. Virgina Jock next week.  He presents for a one year evaluation.   Past Medical History:  Diagnosis Date  . CAD (coronary artery disease)    a. Inf MI/PCI: 100% pRCA (4.0x22 Integrity BMS->dil to 0.10XN)-->AT complicated by VF and CGS;  b. 04/2012 MV: EF 65%, inf attenuation, no ischemia.  . Colon polyps    adenomatous  . Diverticulosis   . Hyperlipidemia   . Hypertensive heart disease   . RBD (REM behavioral disorder) 02/14/2014    Past Surgical History:  Procedure Laterality Date  . broken wrist    . COLONOSCOPY    . CORONARY STENT PLACEMENT     03/2011  . WISDOM TOOTH EXTRACTION      No Known Allergies  Current Outpatient Medications  Medication Sig Dispense Refill  . aspirin 81 MG tablet Take 81 mg by mouth daily.    . clonazePAM (KLONOPIN) 0.5 MG  tablet Take 1.5 tablets (0.75 mg total) by mouth at bedtime. 135 tablet 1  . gabapentin (NEURONTIN) 300 MG capsule TAKE ONE CAPSULE BY MOUTH AT BEDTIME 90 capsule 0  . metoprolol tartrate (LOPRESSOR) 25 MG tablet TAKE 0.5 TABLETS (12.5 MG TOTAL) BY MOUTH 2 (TWO) TIMES DAILY. 30 tablet 0  . Misc Natural Products (TART CHERRY ADVANCED) CAPS Take 1 capsule by mouth daily.    . Multiple Vitamin (MULTIVITAMIN) tablet Take 1 tablet by mouth daily.    . niacin (NIASPAN) 500 MG CR  tablet Take 1 tablet (500 mg total) by mouth at bedtime. 90 tablet 1  . rosuvastatin (CRESTOR) 20 MG tablet TAKE 1 TABLET (20 MG TOTAL) BY MOUTH DAILY. 30 tablet 5  . traMADol (ULTRAM) 50 MG tablet Take 1 tablet (50 mg total) by mouth every 8 (eight) hours as needed. 30 tablet 0  . Turmeric 500 MG CAPS Take 1 capsule by mouth daily.    . Vitamin D, Ergocalciferol, (DRISDOL) 50000 units CAPS capsule TAKE 1 CAPSULE (50,000 UNITS TOTAL) BY MOUTH EVERY 7 (SEVEN) DAYS. 12 capsule 0   No current facility-administered medications for this visit.     Social History   Socioeconomic History  . Marital status: Married    Spouse name: [name]  . Number of children: 1  . Years of education: Bachelor's  . Highest education level: Not on file  Social Needs  . Financial resource strain: Not on file  . Food insecurity - worry: Not on file  . Food insecurity - inability: Not on file  . Transportation needs - medical: Not on file  . Transportation needs - non-medical: Not on file  Occupational History  . Not on file  Tobacco Use  . Smoking status: Never Smoker  . Smokeless tobacco: Never Used  Substance and Sexual Activity  . Alcohol use: No    Alcohol/week: 0.0 oz    Comment: rarely  . Drug use: No  . Sexual activity: Not on file  Other Topics Concern  . Not on file  Social History Narrative   Patient is married ([name]) and lives at home with his wife.   Patient has one adult child.   Patient is working full-time.   Patient has a Bachelor's degree.   Patient is right-handed.   Patient drinks one cup of tea  daily.   Socially, he is married.  He works in Engineer, technical sales.  He is one child.  He exercises 5 days per week.  There is no tobacco use  Family History  Problem Relation Age of Onset  . Cancer Mother        esophagus  . Heart disease Mother   . Hypertension Mother   . Esophageal cancer Mother   . Cancer Father        spinal   . Colon cancer Neg Hx   . Rectal cancer Neg Hx   . Stomach  cancer Neg Hx    ROS General: Negative; No fevers, chills, or night sweats;  HEENT: Negative; No changes in vision or hearing, sinus congestion, difficulty swallowing Pulmonary: Negative; No cough, wheezing, shortness of breath, hemoptysis Cardiovascular: Negative; No chest pain, presyncope, syncope, palpitations GI: Negative; No nausea, vomiting, diarrhea, or abdominal pain GU: Negative; No dysuria, hematuria, or difficulty voiding Musculoskeletal: Negative; no myalgias, joint pain, or weakness Hematologic/Oncology: Negative; no easy bruising, bleeding Endocrine: Negative; no heat/cold intolerance; no diabetes Neuro: Negative; no changes in balance, headaches Skin: Negative; No rashes or skin lesions Psychiatric: Negative;  No behavioral problems, depression Sleep: Positive for REM sleep behavior disorder/parasomnia; No snoring, daytime sleepiness, hypersomnolence, bruxism, restless legs, hypnogognic hallucinations, no cataplexy Other comprehensive 14 point system review is negative.   PE BP 118/72   Pulse (!) 59   Ht 6' (1.829 m)   Wt 199 lb 3.2 oz (90.4 kg)   BMI 27.02 kg/m    Repeat blood pressure was 114/72  Wt Readings from Last 3 Encounters:  06/07/17 199 lb 3.2 oz (90.4 kg)  03/10/17 195 lb (88.5 kg)  02/08/17 198 lb (89.8 kg)     Physical Exam BP 118/72   Pulse (!) 59   Ht 6' (1.829 m)   Wt 199 lb 3.2 oz (90.4 kg)   BMI 27.02 kg/m  General: Alert, oriented, no distress.  Skin: normal turgor, no rashes, warm and dry HEENT: Normocephalic, atraumatic. Pupils equal round and reactive to light; sclera anicteric; extraocular muscles intact;  Nose without nasal septal hypertrophy Mouth/Parynx benign; Mallinpatti scale 2 Neck: No JVD, no carotid bruits; normal carotid upstroke Lungs: clear to ausculatation and percussion; no wheezing or rales Chest wall: without tenderness to palpitation Heart: PMI not displaced, RRR, s1 s2 normal, 1/6 systolic murmur, no diastolic  murmur, no rubs, gallops, thrills, or heaves Abdomen: soft, nontender; no hepatosplenomehaly, BS+; abdominal aorta nontender and not dilated by palpation. Back: no CVA tenderness Pulses 2+ Musculoskeletal: full range of motion, normal strength, no joint deformities Extremities: no clubbing cyanosis or edema, Homan's sign negative  Neurologic: grossly nonfocal; Cranial nerves grossly wnl Psychologic: Normal mood and affect   ECG (independently read by me): sinus bradycardia with PAC.  Normal intervals per no ST seg  November 2017 ECG (independently read by me): Sinus bradycardia 54 bpm.  ECG shows small inferior Q waves with preserved R waves.  May 2016 ECG (independently read by me): Sinus bradycardia 49 bpm.  No ectopy.  Normal intervals.  No ST segment changes.  April 2015 ECG today (Independently read by me): Sinus rhythm at 58 beats per minute.  No significant ST changes.  No evidence for prior myocardial infarction.  Prior 04/25/2013: ECG: Sinus bradycardia at 53 beats per minute. Normal. Very diminutive nondiagnostic Q waves in II, III, and F with preserved R waves concordant with his myocardial salvage   LABS: BMP Latest Ref Rng & Units 04/19/2011 04/17/2011 04/16/2011  Glucose 70 - 99 mg/dL 92 104(H) 140(H)  BUN 6 - 23 mg/dL 8 7 10   Creatinine 0.50 - 1.35 mg/dL 0.73 0.69 0.73  Sodium 135 - 145 mEq/L 137 135 136  Potassium 3.5 - 5.1 mEq/L 3.7 3.7 4.4  Chloride 96 - 112 mEq/L 104 105 107  CO2 19 - 32 mEq/L 25 24 21   Calcium 8.4 - 10.5 mg/dL 9.0 8.3(L) 7.9(L)   Hepatic Function Latest Ref Rng & Units 04/16/2011 04/16/2011  Total Protein 6.0 - 8.3 g/dL 5.7(L) 6.1  Albumin 3.5 - 5.2 g/dL 3.2(L) 3.6  AST 0 - 37 U/L 89(H) 14  ALT 0 - 53 U/L 18 11  Alk Phosphatase 39 - 117 U/L 60 64  Total Bilirubin 0.3 - 1.2 mg/dL 0.4 0.4   CBC Latest Ref Rng & Units 04/17/2011 04/16/2011 04/16/2011  WBC 4.0 - 10.5 K/uL 11.1(H) 13.2(H) -  Hemoglobin 13.0 - 17.0 g/dL 12.8(L) 13.1 13.9  Hematocrit  39.0 - 52.0 % 37.1(L) 36.7(L) 41.0  Platelets 150 - 400 K/uL 134(L) 162 -   No results found for: HGBA1C  Lab Results  Component Value Date  TSH 0.566 04/16/2011   Lipid Panel     Component Value Date/Time   CHOL 165 04/16/2011 0700   TRIG 65 04/16/2011 0700   HDL 49 04/16/2011 0700   CHOLHDL 3.4 04/16/2011 0700   VLDL 13 04/16/2011 0700   LDLCALC 103 (H) 04/16/2011 0700      RADIOLOGY: No results found.  IMPRESSION: 1. Coronary artery disease involving native coronary artery of native heart without angina pectoris   2. Old MI (myocardial infarction) with recurrent VF and initial cardiogenic shock   3. Hyperlipidemia with target LDL less than 70   4. Medication management   5. RBD (REM behavioral disorder)     ASSESSMENT AND PLAN: Mr. Kamar Callender is a 66 year old male who is 6 years following his acute coronary syndrome/inferior wall ST segment elevation myocardial infarction in 02/4165 which was complicated by recurrent episodes of ventricular fibrillation, cardiogenic shock which required multiple defibrillations. He has been demonstrated to have essentially complete salvage of myocardium in the RCA territory.  He did not have concomitant CAD with his other coronaries being normal.  His last nuclear stress test was in 2017 which remained normal.  Over the past year, he continues to do well from a cardiac standpoint.  His blood pressure today is stable on metoprolol 12.5 mg twice a day.  He has been on rosuvastatin 20 mg in addition to niacin for hyperlipidemia.  In the past he had an elevation of LPa treated with niacin. He will be undergoing laboratory.  Next week with his primary physician and I have suggested at that time that [place] a also be checked in addition to his other labs.  One total cholesterol was 159 with triglycerides 49, HDL 71, and LDL 78, on treatment.  Target LDL is less than 70.  He is had difficulty with right lower extremity sciatica from nerve impingement  at L4-L5 for which he is required to spinal injections.  He is on gabapentin.  He also has been taking tumeric and tramadol as needed.  Remotely, he had been on the APTT, but now is just on aspirin alone and had difficulty when Plavix was instituted leading to its discontinuance.  Remotely, he also had had possible REM sleep behavior disorder would fit, frightful dreams.  He has been stable.  He is contemplating retiring next year. I will await the results of his laboratory to be done by Dr. Virgina Jock. .  I will see him in one year for reevaluation  Time spent: 25 minutes  Troy Sine, MD, [facility]  06/09/2017 12:25 PM

## 2017-06-07 NOTE — Patient Instructions (Signed)
Medication Instructions:  Your physician recommends that you continue on your current medications as directed. Please refer to the Current Medication list given to you today.  Labwork: Labs at PCP-lab slip provided for Lipoprotein(a)  Follow-Up: Your physician wants you to follow-up in: 12 months with Dr. Tresa EndoKelly. You will receive a reminder letter in the mail two months in advance. If you don't receive a letter, please call our office to schedule the follow-up appointment.   Any Other Special Instructions Will Be Listed Below (If Applicable).     If you need a refill on your cardiac medications before your next appointment, please call your pharmacy.

## 2017-06-09 ENCOUNTER — Encounter: Payer: Self-pay | Admitting: Cardiovascular Disease

## 2017-06-12 ENCOUNTER — Encounter: Payer: Self-pay | Admitting: Family Medicine

## 2017-06-12 DIAGNOSIS — IMO0001 Reserved for inherently not codable concepts without codable children: Secondary | ICD-10-CM

## 2017-06-20 ENCOUNTER — Ambulatory Visit
Admission: RE | Admit: 2017-06-20 | Discharge: 2017-06-20 | Disposition: A | Discharge status: Home / Self Care | Payer: BLUE CROSS/BLUE SHIELD | Source: Ambulatory Visit | Attending: Family Medicine | Admitting: Family Medicine

## 2017-06-20 DIAGNOSIS — IMO0001 Reserved for inherently not codable concepts without codable children: Secondary | ICD-10-CM

## 2017-06-20 MED ORDER — METHYLPREDNISOLONE ACETATE 40 MG/ML INJ SUSP (RADIOLOG
120.0000 mg | Freq: Once | INTRAMUSCULAR | Status: AC
  Administered 2017-06-20: 120 mg via EPIDURAL

## 2017-06-20 MED ORDER — IOPAMIDOL (ISOVUE-M 200) INJECTION 41%
1.0000 mL | Freq: Once | INTRAMUSCULAR | Status: AC
  Administered 2017-06-20: 1 mL via EPIDURAL

## 2017-06-22 ENCOUNTER — Other Ambulatory Visit: Payer: Self-pay | Admitting: Cardiovascular Disease

## 2017-07-08 ENCOUNTER — Other Ambulatory Visit: Payer: Self-pay | Admitting: Cardiovascular Disease

## 2017-07-10 NOTE — Telephone Encounter (Signed)
REFILL 

## 2017-07-19 ENCOUNTER — Other Ambulatory Visit: Payer: Self-pay | Admitting: *Deleted

## 2017-07-19 MED ORDER — ROSUVASTATIN CALCIUM 40 MG PO TABS: 40.0000 mg | ORAL_TABLET | Freq: Every day | ORAL | Status: AC

## 2017-08-17 ENCOUNTER — Other Ambulatory Visit: Payer: Self-pay | Admitting: Family Medicine

## 2017-09-05 ENCOUNTER — Other Ambulatory Visit: Payer: Self-pay | Admitting: Neurosurgery

## 2017-09-05 DIAGNOSIS — M5440 Lumbago with sciatica, unspecified side: Secondary | ICD-10-CM

## 2017-09-19 ENCOUNTER — Ambulatory Visit
Admission: RE | Admit: 2017-09-19 | Discharge: 2017-09-19 | Disposition: A | Discharge status: Home / Self Care | Payer: Medicare Other | Source: Ambulatory Visit | Attending: Neurosurgery | Admitting: Neurosurgery

## 2017-09-19 DIAGNOSIS — M5440 Lumbago with sciatica, unspecified side: Secondary | ICD-10-CM

## 2017-09-19 MED ORDER — METHYLPREDNISOLONE ACETATE 40 MG/ML INJ SUSP (RADIOLOG
120.0000 mg | Freq: Once | INTRAMUSCULAR | Status: AC
  Administered 2017-09-19: 120 mg via EPIDURAL

## 2017-09-19 MED ORDER — IOPAMIDOL (ISOVUE-M 200) INJECTION 41%
1.0000 mL | Freq: Once | INTRAMUSCULAR | Status: AC
  Administered 2017-09-19: 1 mL via EPIDURAL

## 2017-09-19 NOTE — Discharge Instructions (Signed)

## 2017-09-30 ENCOUNTER — Other Ambulatory Visit: Payer: Self-pay | Admitting: Neurology

## 2017-11-08 ENCOUNTER — Other Ambulatory Visit: Payer: Self-pay | Admitting: Family Medicine

## 2017-11-08 NOTE — Telephone Encounter (Signed)
Refill done.  

## 2018-02-07 ENCOUNTER — Encounter: Payer: Self-pay | Admitting: Neurology

## 2018-02-07 ENCOUNTER — Ambulatory Visit: Payer: BLUE CROSS/BLUE SHIELD | Admitting: Neurology

## 2018-02-07 VITALS — BP 116/71 | HR 58 | Ht 72.0 in | Wt 193.0 lb

## 2018-02-07 DIAGNOSIS — G4752 REM sleep behavior disorder: Secondary | ICD-10-CM

## 2018-02-07 NOTE — Progress Notes (Signed)
[facility] SLEEP MEDICINE CONSULT   Provider:  Melvyn Watkins, M D  Referring Provider: Creola Corn, MD Primary Care Physician:  Devin Corn, MD  Chief Complaint  Patient presents with  . Follow-up    Room 10. Pt here alone.     HPI: 02-07-2018,  [name] is a 67 y.o. caucasian, married, right handed male , who is seen here as a follow up after a sleep study. He is doing well, has not yet retired. 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 He sold his family home, the couple has an empty nest. Son lives in [place].  He has been sleeping OK. Night terrors are rare- and sleep is less disrupted. Klonopin 0.75 mg. Some nights he will have red wine, but will not take klonopin. He is still high cholesterol- Crestor increased. Routine visit.     01-2017 CD He advised me that he has not been diagnosed with Diverticulitis or hypertension as mentioned in his sleep study history .  Sleep sStudy was performed on 01-30-14. It documented a very mild AHI of 6.8 and an RDI of 6.8 supine sleep and REM stage and it accentuates the AHI REM AHI was 11.6 in supine AHI was 14 based on this my main suggestion to reduce snoring or apnea would be to avoid the supine sleep position. The patient had 22 minutes total throughout the night of low oxygen levels the oxygen nadir was 88% which is not significant. He did have some trouble to stay asleep there were multiple episodes of REM behavior disorder nor does 1 was captured on video and was clearly understandable that the patient's poor blood and another episode in more detail,  that document of these behaviors. During REM sleep limb movement and his muscle tone was still activated confirming the diagnosis of REM behavior disorder.   As we have discussed in our previous visit there is a higher overlap of REM behavior disorder leading to Parkinson's disease. The patient had started taking Klonopin and has noticed a significant decrease in the  frequency of salsa he is combined at night with melatonin, which seems to have complimented the Klonopin. [name] reports having vivid nightmares, usually being chased or attacked by an animal. He usually moves in bed- but at least once left the bed and tried to close and block the door. He has banged on the pillow , accidentally hitting his wife, who can display the bruises here.  He reported having these dreams and acting out periods up to 4 times a night, and beginning much more infrequently the year before his heart attack in 2012.  He goes to bed around 11 PM and falls asleep promptly, his bedroom is quiet , dark and cool, he shares the bed with his wife.  By 1.30 or 2 AM he has often the  first display of REM BD . He reported not having vivid nightmares for about 6 month after his heart attack, which could be related to medication changes. He goes to the bathroom 2-3 times , and he has a high fluid intake, not caffeine, no ETOH, never been a smoker. The rise time in the morning is 5.30, he wakes spontaneously. He may sleep until 6 AM on weekends. He does not have a headache, he feels refreshed and restored. He snores, but his wife has not noted any apnea. Snoring is supine the loudest . He has a office with frosted glass window, and gets out of doors for lunch.  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   He has been hyperactive to some degree, always has  nervously moved his legs, tapped his feet.  No falls, no BP variability, no near syncope.   Interval history from 02-17-15. This is a yearly revisit, patient Devin LeitzMark Watkins last seen in July 2015, patent of Dr [name].  [name] is followed here for REM behavior disorder we are requesting him to fill sleep related questionnaires as well as memory or cognitive tests. He endorsed the geriatric depression score at 2 points which is clinically deemed insignificant the Epworth sleepiness score at 7 which is a normal range and the fatigue  severity score at 20 points also in normal range. He did excellent with the Montral cognitive assessment test. I prefer to apply this test over her Mini-Mental Status Examination due to the patient's age and level of education. The patient was well able to pass the trail making test, copied a three-dimensional cube image and to the clock face. He was also able to name 3 animals do the serial 7 subtraction the language obstruction and fluency test and he recalled 3 out of 5 recall words as well as being fully oriented to date time and place. The word fluency was 14 words beginning with the letter F. His score was 28 points out of a possible 30 and she lost 2 points to delayed recall. This is not indicative of significant short-term memory loss.  She reports no tremors no quivering voice, no falls or gait instability. No cogwheeling is noted. He also does not suffer from restless legs. The patient reports that his REM behavior disorder has not affected him significantly over the last 12 months he did have a bout of several nights about 4 months ago. However this has not recurred since and he states that it was several months before that about that he had any REM behavior that his wife had witnessed. He remains of course  unawareof the disorder. He sometimes mumbles. No hitting and no thrashing. He reports that he cannot recall his visit dreams that he used to have before.  Interval history from 01/05/2016. [name] is seen here today after a recent uptake in REM behavior activity. He reports that for a week or so he has been almost nightly engaged and acting out dreams but for the very last week prior to this visit the activity has ceased again. There is stress at work, he is 67 years old and feels unready to retire. He is ready to increase Klonopin.  Interval history 02-07-2017, I had the pleasure of seeing [name] today who again scored entirely normal on a Montral cognitive assessment, endorsed 25  points on the fatigue severity scale and 5 points on the Epworth Sleepiness Scale, and 3 out of 15 points in the geriatric  depression score. REM activity is controlled. Klonopin. He has a lot of stress, describes a burn out. 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    Review of Systems:  Out of a complete 14 system review, the patient complains of only the following symptoms, and all other reviewed systems are negative. See above .   Social History   Socioeconomic History  . Marital status: Married    Spouse name: [name]  . Number of children: 1  . Years of education: Bachelor's  . Highest education level: Not on file  Occupational History  . Not on file  Social Needs  .  Financial resource strain: Not on file  . Food insecurity:    Worry: Not on file    Inability: Not on file  . Transportation needs:    Medical: Not on file    Non-medical: Not on file  Tobacco Use  . Smoking status: Never Smoker  . Smokeless tobacco: Never Used  Substance and Sexual Activity  . Alcohol use: No    Alcohol/week: 0.0 oz    Comment: rarely  . Drug use: No  . Sexual activity: Not on file  Lifestyle  . Physical activity:    Days per week: Not on file    Minutes per session: Not on file  . Stress: Not on file  Relationships  . Social connections:    Talks on phone: Not on file    Gets together: Not on file    Attends religious service: Not on file    Active member of club or organization: Not on file    Attends meetings of clubs or organizations: Not on file    Relationship status: Not on file  . Intimate partner violence:    Fear of current or ex partner: Not on file    Emotionally abused: Not on file    Physically abused: Not on file    Forced sexual activity: Not on file  Other Topics Concern  . Not on file  Social History Narrative   Patient is married ([name]) and lives at home with his wife.   Patient has one adult child.   Patient is working full-time.   Patient  has a Bachelor's degree.   Patient is right-handed.   Patient drinks one cup of tea  daily.    Family History  Problem Relation Age of Onset  . Cancer Mother        esophagus  . Heart disease Mother   . Hypertension Mother   . Esophageal cancer Mother   . Cancer Father        spinal   . Colon cancer Neg Hx   . Rectal cancer Neg Hx   . Stomach cancer Neg Hx     Past Medical History:  Diagnosis Date  . CAD (coronary artery disease)    a. Inf MI/PCI: 100% pRCA (4.0x22 Integrity BMS->dil to 4.34mm)-->MI complicated by VF and CGS;  b. 04/2012 MV: EF 65%, inf attenuation, no ischemia.  . Colon polyps    adenomatous  . Diverticulosis   . Hyperlipidemia   . Hypertensive heart disease   . RBD (REM behavioral disorder) 02/14/2014    Past Surgical History:  Procedure Laterality Date  . broken wrist    . COLONOSCOPY    . CORONARY STENT PLACEMENT     03/2011  . WISDOM TOOTH EXTRACTION      Current Outpatient Medications  Medication Sig Dispense Refill  . aspirin 81 MG tablet Take 81 mg by mouth daily.    . Cholecalciferol (VITAMIN D PO) Take by mouth.    . clonazePAM (KLONOPIN) 0.5 MG tablet TAKE 1.5 TABLETS BY MOUTH AT BEDTIME 135 tablet 1  . metoprolol tartrate (LOPRESSOR) 25 MG tablet TAKE 0.5 TABLETS (12.5 MG TOTAL) BY MOUTH 2 (TWO) TIMES DAILY. 30 tablet 11  . Misc Natural Products (TART CHERRY ADVANCED) CAPS Take 1 capsule by mouth daily.    . niacin (NIASPAN) 500 MG CR tablet Take 1 tablet (500 mg total) by mouth at bedtime. 90 tablet 1  . rosuvastatin (CRESTOR) 40 MG tablet Take 1 tablet (40 mg total) by  mouth daily.    . Turmeric 500 MG CAPS Take 1 capsule by mouth daily.     No current facility-administered medications for this visit.     Allergies as of 02/07/2018  . (No Known Allergies)    Vitals: BP 116/71   Pulse (!) 58   Ht 6' (1.829 m)   Wt 193 lb (87.5 kg)   BMI 26.18 kg/m  Last Weight:  Wt Readings from Last 1 Encounters:  02/07/18 193 lb (87.5  kg)   Last Height:   Ht Readings from Last 1 Encounters:  02/07/18 6' (1.829 m)    Physical exam:  General: The patient is awake, alert and appears not in acute distress. The patient is well groomed. Head: Normocephalic, atraumatic. Neck is supple. Mallampati 4  neck circumference: 15.5  Inches . He has TMJ click on the left, no delayed swallowing.  Cardiovascular:  Regular rate and rhythm ,  without  murmurs or carotid bruit, and without distended neck veins. Respiratory: Lungs are clear to auscultation. Skin:  Without evidence of edema, or rash Trunk:  Patient has normal posture.  Neurologic exam : The patient is awake and alert, oriented to place and time. Memory : performed 2016 a  MOCA with patient , who lost 2 of 5 words in recall. No repeat in 2017. Montreal Cognitive Assessment  02/07/2017  Visuospatial/ Executive (0/5) 5  Naming (0/3) 3  Attention: Read list of digits (0/2) 2  Attention: Read list of letters (0/1) 1  Attention: Serial 7 subtraction starting at 100 (0/3) 3  Language: Repeat phrase (0/2) 2  Language : Fluency (0/1) 1  Abstraction (0/2) 2  Delayed Recall (0/5) 2  Orientation (0/6) 5  Total 26     There is a normal attention span & concentration ability. He became tearful when he described his work situation.  Speech is fluent without  dysarthria, dysphonia or aphasia. No hoarseness. Mood and affect are appropriate.  Cranial nerves: Pupils are equal and briskly reactive to light. Funduscopic exam without  evidence of pallor or edema.   Floaters  In the right lens. Extraocular movements  in vertical and horizontal planes intact and without nystagmus. Visual fields by finger perimetry are intact. Hearing to finger rub intact.  Facial sensation intact to fine touch. Facial motor strength is symmetric and tongue and uvula move midline. Motor exam:  Normal tone- no cog wheeling  , muscle bulk and symmetric normal strength in all extremities.  He has no longer  cogwheel rigidity over the left biceps, not the wrist not the shoulder, this could related to a C5 radiculopathy.  Sensory:  No deficits. Coordination: Finger-to-nose maneuvernormal without evidence of ataxia, but there was mild dysmetria,  there was a mild action tremor in the right.  Gait and station: Patient walks without assistive device, steady and with normal armswing. Not stooped.  Strength within normal limits. Stance is stable and of normal base.  Tandem gait is intact, turns with 2 Steps, which  are unfragmented. Romberg testing- no fall tendency  Deep tendon reflexes: in the  upper and lower extremities are symmetric and intact.   Assessment:  After physical and neurologic examination, review of laboratory studies, imaging, neurophysiology testing and pre-existing records, assessment is :  REM BD  With nocturnal  kicking and nightmares. Confirmed REM BD in sleep study,  On betablocker- helps Tremor, anxiety.  No evidence of MCI.  Hip pain evaluated by Dr. Wynetta Emery. Helped by an injection in lower back.  Plan:  Klonopin 0.5 mg nightly, prn use twice at night.  Continue medication, yearly RV for REM BD with MOCA.     Devin Novas, MD

## 2018-02-07 NOTE — Patient Instructions (Signed)
Clonazepam tablets What is this medicine? CLONAZEPAM (kloe NA ze pam) is a benzodiazepine. It is used to treat certain types of seizures. It is also used to treat panic disorder. This medicine may be used for other purposes; ask your health care provider or pharmacist if you have questions. COMMON BRAND NAME(S): Ceberclon, Klonopin What should I tell my health care provider before I take this medicine? They need to know if you have any of these conditions: -an alcohol or drug abuse problem -bipolar disorder, depression, psychosis or other mental health condition -glaucoma -kidney or liver disease -lung or breathing disease -myasthenia gravis -Parkinson's disease -porphyria -seizures or a history of seizures -suicidal thoughts -an unusual or allergic reaction to clonazepam, other benzodiazepines, foods, dyes, or preservatives -pregnant or trying to get pregnant -breast-feeding How should I use this medicine? Take this medicine by mouth with a glass of water. Follow the directions on the prescription label. If it upsets your stomach, take it with food or milk. Take your medicine at regular intervals. Do not take it more often than directed. Do not stop taking or change the dose except on the advice of your doctor or health care professional. A special MedGuide will be given to you by the pharmacist with each prescription and refill. Be sure to read this information carefully each time. Talk to your pediatrician regarding the use of this medicine in children. Special care may be needed. Overdosage: If you think you have taken too much of this medicine contact a poison control center or emergency room at once. NOTE: This medicine is only for you. Do not share this medicine with others. What if I miss a dose? If you miss a dose, take it as soon as you can. If it is almost time for your next dose, take only that dose. Do not take double or extra doses. What may interact with this medicine? Do  not take this medication with any of the following medicines: -narcotic medicines for cough -sodium oxybate This medicine may also interact with the following medications: -alcohol -antihistamines for allergy, cough and cold -antiviral medicines for HIV or AIDS -certain medicines for anxiety or sleep -certain medicines for depression, like amitriptyline, fluoxetine, sertraline -certain medicines for fungal infections like ketoconazole and itraconazole -certain medicines for seizures like carbamazepine, phenobarbital, phenytoin, primidone -general anesthetics like halothane, isoflurane, methoxyflurane, propofol -local anesthetics like lidocaine, pramoxine, tetracaine -medicines that relax muscles for surgery -narcotic medicines for pain -phenothiazines like chlorpromazine, mesoridazine, prochlorperazine, thioridazine This list may not describe all possible interactions. Give your health care provider a list of all the medicines, herbs, non-prescription drugs, or dietary supplements you use. Also tell them if you smoke, drink alcohol, or use illegal drugs. Some items may interact with your medicine. What should I watch for while using this medicine? Tell your doctor or health care professional if your symptoms do not start to get better or if they get worse. Do not stop taking except on your doctor's advice. You may develop a severe reaction. Your doctor will tell you how much medicine to take. You may get drowsy or dizzy. Do not drive, use machinery, or do anything that needs mental alertness until you know how this medicine affects you. To reduce the risk of dizzy and fainting spells, do not stand or sit up quickly, especially if you are an older patient. Alcohol may increase dizziness and drowsiness. Avoid alcoholic drinks. If you are taking another medicine that also causes drowsiness, you may have more side   effects. Give your health care provider a list of all medicines you use. Your doctor  will tell you how much medicine to take. Do not take more medicine than directed. Call emergency for help if you have problems breathing or unusual sleepiness. The use of this medicine may increase the chance of suicidal thoughts or actions. Pay special attention to how you are responding while on this medicine. Any worsening of mood, or thoughts of suicide or dying should be reported to your health care professional right away. What side effects may I notice from receiving this medicine? Side effects that you should report to your doctor or health care professional as soon as possible: -allergic reactions like skin rash, itching or hives, swelling of the face, lips, or tongue -breathing problems -confusion -loss of balance or coordination -signs and symptoms of low blood pressure like dizziness; feeling faint or lightheaded, falls; unusually weak or tired -suicidal thoughts or mood changes Side effects that usually do not require medical attention (report to your doctor or health care professional if they continue or are bothersome): -dizziness -headache -tiredness -upset stomach This list may not describe all possible side effects. Call your doctor for medical advice about side effects. You may report side effects to FDA at 1-800-FDA-1088. Where should I keep my medicine? Keep out of the reach of children. This medicine can be abused. Keep your medicine in a safe place to protect it from theft. Do not share this medicine with anyone. Selling or giving away this medicine is dangerous and against the law. This medicine may cause accidental overdose and death if taken by other adults, children, or pets. Mix any unused medicine with a substance like cat litter or coffee grounds. Then throw the medicine away in a sealed container like a sealed bag or a coffee can with a lid. Do not use the medicine after the expiration date. Store at room temperature between 15 and 30 degrees C (59 and 86 degrees F).  Protect from light. Keep container tightly closed. NOTE: This sheet is a summary. It may not cover all possible information. If you have questions about this medicine, talk to your doctor, pharmacist, or health care provider.  2018 Elsevier/Gold Standard (2015-12-11 18:46:32)  

## 2018-02-08 ENCOUNTER — Other Ambulatory Visit: Payer: Self-pay | Admitting: Cardiovascular Disease

## 2018-02-08 NOTE — Telephone Encounter (Signed)
Rx request sent to pharmacy.  

## 2018-02-09 ENCOUNTER — Encounter: Payer: Self-pay | Admitting: Cardiovascular Disease

## 2018-02-27 ENCOUNTER — Other Ambulatory Visit: Payer: Self-pay | Admitting: Student

## 2018-02-27 DIAGNOSIS — M5440 Lumbago with sciatica, unspecified side: Secondary | ICD-10-CM

## 2018-03-14 ENCOUNTER — Ambulatory Visit
Admission: RE | Admit: 2018-03-14 | Discharge: 2018-03-14 | Disposition: A | Discharge status: Home / Self Care | Payer: Medicare Other | Source: Ambulatory Visit | Attending: Student | Admitting: Student

## 2018-03-14 DIAGNOSIS — M5440 Lumbago with sciatica, unspecified side: Secondary | ICD-10-CM

## 2018-03-14 MED ORDER — IOPAMIDOL (ISOVUE-M 200) INJECTION 41%
1.0000 mL | Freq: Once | INTRAMUSCULAR | Status: AC
  Administered 2018-03-14: 1 mL via EPIDURAL

## 2018-03-14 MED ORDER — METHYLPREDNISOLONE ACETATE 40 MG/ML INJ SUSP (RADIOLOG
120.0000 mg | Freq: Once | INTRAMUSCULAR | Status: AC
  Administered 2018-03-14: 120 mg via EPIDURAL

## 2018-03-22 ENCOUNTER — Other Ambulatory Visit: Payer: Self-pay | Admitting: Student

## 2018-03-22 DIAGNOSIS — M5440 Lumbago with sciatica, unspecified side: Secondary | ICD-10-CM

## 2018-04-09 ENCOUNTER — Other Ambulatory Visit: Payer: Self-pay | Admitting: Neurology

## 2018-04-09 MED ORDER — CLONAZEPAM 0.5 MG PO TABS: ORAL_TABLET | ORAL | 5 refills | Status: DC

## 2018-04-24 ENCOUNTER — Other Ambulatory Visit: Payer: Self-pay | Admitting: Cardiovascular Disease

## 2018-07-23 ENCOUNTER — Other Ambulatory Visit: Payer: Self-pay | Admitting: Student

## 2018-07-23 DIAGNOSIS — M5440 Lumbago with sciatica, unspecified side: Secondary | ICD-10-CM

## 2018-07-25 ENCOUNTER — Other Ambulatory Visit: Payer: Self-pay | Admitting: Cardiovascular Disease

## 2018-07-26 ENCOUNTER — Encounter: Payer: Self-pay | Admitting: Cardiovascular Disease

## 2018-07-26 ENCOUNTER — Ambulatory Visit: Payer: Medicare Other | Admitting: Cardiovascular Disease

## 2018-07-26 VITALS — BP 131/78 | HR 47 | Ht 72.0 in | Wt 199.2 lb

## 2018-07-26 DIAGNOSIS — G4752 REM sleep behavior disorder: Secondary | ICD-10-CM

## 2018-07-26 DIAGNOSIS — E7841 Elevated Lipoprotein(a): Secondary | ICD-10-CM

## 2018-07-26 DIAGNOSIS — E785 Hyperlipidemia, unspecified: Secondary | ICD-10-CM | POA: Diagnosis not present

## 2018-07-26 DIAGNOSIS — I2510 Atherosclerotic heart disease of native coronary artery without angina pectoris: Secondary | ICD-10-CM | POA: Diagnosis not present

## 2018-07-26 MED ORDER — METOPROLOL SUCCINATE ER 25 MG PO TB24: 25.0000 mg | ORAL_TABLET | Freq: Every day | ORAL | 1 refills | Status: DC

## 2018-07-26 NOTE — Progress Notes (Signed)
Patient ID: [name], male   DOB: Jul 29, 1950, 68 y.o.   MRN: [id number]        HPI: [name] is a 68 y.o. male who presents for a 68 month cardiology evaluation.  Mr. Wendorff developed an acute coronary syndrome/ST segment elevation inferior wall myocardial infarction on 04/16/2011. At that time, he had never had any cardiac symptoms. Immediately upon arrival to the catheterization laboratory he developed recurrent episodes of ventricular fibrillation and required multiple defibrillations. At catheterization he was found to have total proximal RCA occlusion in a very dominant vessel. He was in cardiogenic shock initially had significant for wall motion abnormality. I performed successful intervention and ultimately placed a 4.0x22 mm integrity stent postdilated to 4.38 mm in a large proximal right coronary artery. Ejection fraction ultimately improved to approximately 50-55%. A nuclear perfusion study in October 2013 showed essentially complete salvage of myocardium. He was on Brilinta for approximately 15 months and ultimately this was discontinued and he has been on aspirin alone. Also has been on combination therapy with low-dose Niaspan Crestor in addition to low-dose beta blocker therapy.  Since I last saw him he has remained cardiac stable.  He denies recurrent chest pain.  There are no palpitations.  He denies any PND or orthopnea.  He is unaware of any episodes of tachycardia.  He denies presyncope or syncope.  He does admit to increased work related stress.  However, he is managing to exercise 5 days per week.  He sees Dr. Virgina Jock, for primary care .  Laboratory has been checked by Dr. Virgina Jock.  He  was diagnosed as having insomnia with a REM sleep behavior disorder associated with  vivid frightful dreams, thrashing with viscious animals.  He has seen Dr. Roddie Mc and was started on Klonopin with some improvement in his symptomatology.  When he was last seen by her, Klonopin dose was increased.   He now takes 1.5 mg at bedtime.  He underwent a four-year follow-up nuclear perfusion study on 11/24/2015, which remained normal.  There was mild inferolateral attenuation but no evidence for prior infarction or ischemia.  Ejection fraction was 50%.    I last saw him in November 2018 at which time he was remaining stable from a cardiovascular standpoint., He has had difficulty with right sciatica resulting from L4, L5 degenerative disc disease which was confirmed on MRI.  He has undergone 2 spinal injections over the past several months.  He is still working.  He denied any episodes of chest pain, PND, orthopnea or change in exercise tolerance.    Since I last saw him, he has continued to feel well from a cardiac standpoint.  He is followed by Dr. Virgina Jock.  He is still working.  He is tentatively planning for possible retirement at the end of the year.  He has continued to have some issues with sciatica for which he sees Dr. Saintclair Halsted and undergoes injections every 4 to 5 months.  He denies any recurrent anginal symptomatology.  In the past, he was found to have significant elevation of Lp(a) and when last checked by me in December 2018 LP(a) was significantly elevated at 148.  In July this apparently had been reduced to 130.  I last saw him, I recommended further titration of rosuvastatin.  He has continued to be on niacin.  He tells me Dr. Virgina Jock had just rechecked laboratory in December and he was told that his LPA was further reduced but he did not know the number and  I have not received the laboratory.  He wears a bit watch.  Typically his heart rate has been running in the low to mid 50s.  He is asymptomatic.  He denies chest pain, presyncope or syncope, or palpitations.  He presents for evaluation.  Past Medical History:  Diagnosis Date  . CAD (coronary artery disease)    a. Inf MI/PCI: 100% pRCA (4.0x22 Integrity BMS->dil to 9.35TS)-->VX complicated by VF and CGS;  b. 04/2012 MV: EF 65%, inf attenuation,  no ischemia.  . Colon polyps    adenomatous  . Diverticulosis   . Hyperlipidemia   . Hypertensive heart disease   . RBD (REM behavioral disorder) 02/14/2014    Past Surgical History:  Procedure Laterality Date  . broken wrist    . COLONOSCOPY    . CORONARY STENT PLACEMENT     03/2011  . WISDOM TOOTH EXTRACTION      No Known Allergies  Current Outpatient Medications  Medication Sig Dispense Refill  . aspirin 81 MG tablet Take 81 mg by mouth daily.    . Cholecalciferol (VITAMIN D PO) Take by mouth.    . clonazePAM (KLONOPIN) 0.5 MG tablet Take 1.5 tablet by mouth at bedtime as needed 45 tablet 5  . Misc Natural Products (TART CHERRY ADVANCED) CAPS Take 1 capsule by mouth daily.    . niacin (NIASPAN) 500 MG CR tablet TAKE 1 TABLET BY MOUTH EVERY DAY AT BEDTIME 90 tablet 1  . rosuvastatin (CRESTOR) 40 MG tablet Take 1 tablet (40 mg total) by mouth daily.    . Turmeric 500 MG CAPS Take 1 capsule by mouth daily.    . metoprolol succinate (TOPROL XL) 25 MG 24 hr tablet Take 1 tablet (25 mg total) by mouth daily. 90 tablet 1   No current facility-administered medications for this visit.     Social History   Socioeconomic History  . Marital status: Married    Spouse name: [name]  . Number of children: 1  . Years of education: Bachelor's  . Highest education level: Not on file  Occupational History  . Not on file  Social Needs  . Financial resource strain: Not on file  . Food insecurity:    Worry: Not on file    Inability: Not on file  . Transportation needs:    Medical: Not on file    Non-medical: Not on file  Tobacco Use  . Smoking status: Never Smoker  . Smokeless tobacco: Never Used  Substance and Sexual Activity  . Alcohol use: No    Alcohol/week: 0.0 standard drinks    Comment: rarely  . Drug use: No  . Sexual activity: Not on file  Lifestyle  . Physical activity:    Days per week: Not on file    Minutes per session: Not on file  . Stress: Not on file    Relationships  . Social connections:    Talks on phone: Not on file    Gets together: Not on file    Attends religious service: Not on file    Active member of club or organization: Not on file    Attends meetings of clubs or organizations: Not on file    Relationship status: Not on file  . Intimate partner violence:    Fear of current or ex partner: Not on file    Emotionally abused: Not on file    Physically abused: Not on file    Forced sexual activity: Not on file  Other Topics  Concern  . Not on file  Social History Narrative   Patient is married ([name]) and lives at home with his wife.   Patient has one adult child.   Patient is working full-time.   Patient has a Bachelor's degree.   Patient is right-handed.   Patient drinks one cup of tea  daily.   Socially, he is married.  He works in Engineer, technical sales.  He is one child.  He exercises 5 days per week.  There is no tobacco use  Family History  Problem Relation Age of Onset  . Cancer Mother        esophagus  . Heart disease Mother   . Hypertension Mother   . Esophageal cancer Mother   . Cancer Father        spinal   . Colon cancer Neg Hx   . Rectal cancer Neg Hx   . Stomach cancer Neg Hx    ROS General: Negative; No fevers, chills, or night sweats;  HEENT: Negative; No changes in vision or hearing, sinus congestion, difficulty swallowing Pulmonary: Negative; No cough, wheezing, shortness of breath, hemoptysis Cardiovascular: Negative; No chest pain, presyncope, syncope, palpitations GI: Negative; No nausea, vomiting, diarrhea, or abdominal pain GU: Negative; No dysuria, hematuria, or difficulty voiding Musculoskeletal: Negative; no myalgias, joint pain, or weakness Hematologic/Oncology: Negative; no easy bruising, bleeding Endocrine: Negative; no heat/cold intolerance; no diabetes Neuro: Negative; no changes in balance, headaches Skin: Negative; No rashes or skin lesions Psychiatric: Negative; No behavioral problems,  depression Sleep: Positive for REM sleep behavior disorder/parasomnia; No snoring, daytime sleepiness, hypersomnolence, bruxism, restless legs, hypnogognic hallucinations, no cataplexy Other comprehensive 14 point system review is negative.   PE BP 131/78   Pulse (!) 47   Ht 6' (1.829 m)   Wt 199 lb 3.2 oz (90.4 kg)   BMI 27.02 kg/m    Repeat blood pressure was 122/80  Wt Readings from Last 3 Encounters:  07/26/18 199 lb 3.2 oz (90.4 kg)  02/07/18 193 lb (87.5 kg)  06/07/17 199 lb 3.2 oz (90.4 kg)   General: Alert, oriented, no distress.  Skin: normal turgor, no rashes, warm and dry HEENT: Normocephalic, atraumatic. Pupils equal round and reactive to light; sclera anicteric; extraocular muscles intact;  Nose without nasal septal hypertrophy Mouth/Parynx benign; Mallinpatti scale 2 Neck: No JVD, no carotid bruits; normal carotid upstroke Lungs: clear to ausculatation and percussion; no wheezing or rales Chest wall: without tenderness to palpitation Heart: PMI not displaced, RRR, s1 s2 normal, 1/6 systolic murmur, no diastolic murmur, no rubs, gallops, thrills, or heaves Abdomen: soft, nontender; no hepatosplenomehaly, BS+; abdominal aorta nontender and not dilated by palpation. Back: no CVA tenderness Pulses 2+ Musculoskeletal: full range of motion, normal strength, no joint deformities Extremities: no clubbing cyanosis or edema, Homan's sign negative  Neurologic: grossly nonfocal; Cranial nerves grossly wnl Psychologic: Normal mood and affect  ECG (independently read by me): Sinus Bradycardia at 47; Small inferiior Q waves with preserved R waves; normal intervals  June 07, 2018 ECG (independently read by me): Sinus bradycardia with PAC.  Normal intervals per no ST seg  November 2017 ECG (independently read by me): Sinus bradycardia 54 bpm.  ECG shows small inferior Q waves with preserved R waves.  May 2016 ECG (independently read by me): Sinus bradycardia 49 bpm.  No  ectopy.  Normal intervals.  No ST segment changes.  April 2015 ECG today (Independently read by me): Sinus rhythm at 58 beats per minute.  No significant ST changes.  No evidence for prior myocardial infarction.  Prior 04/25/2013: ECG: Sinus bradycardia at 53 beats per minute. Normal. Very diminutive nondiagnostic Q waves in II, III, and F with preserved R waves concordant with his myocardial salvage   LABS: BMP Latest Ref Rng & Units 04/19/2011 04/17/2011 04/16/2011  Glucose 70 - 99 mg/dL 92 104(H) 140(H)  BUN 6 - 23 mg/dL 8 7 10   Creatinine 0.50 - 1.35 mg/dL 0.73 0.69 0.73  Sodium 135 - 145 mEq/L 137 135 136  Potassium 3.5 - 5.1 mEq/L 3.7 3.7 4.4  Chloride 96 - 112 mEq/L 104 105 107  CO2 19 - 32 mEq/L 25 24 21   Calcium 8.4 - 10.5 mg/dL 9.0 8.3(L) 7.9(L)   Hepatic Function Latest Ref Rng & Units 04/16/2011 04/16/2011  Total Protein 6.0 - 8.3 g/dL 5.7(L) 6.1  Albumin 3.5 - 5.2 g/dL 3.2(L) 3.6  AST 0 - 37 U/L 89(H) 14  ALT 0 - 53 U/L 18 11  Alk Phosphatase 39 - 117 U/L 60 64  Total Bilirubin 0.3 - 1.2 mg/dL 0.4 0.4   CBC Latest Ref Rng & Units 04/17/2011 04/16/2011 04/16/2011  WBC 4.0 - 10.5 K/uL 11.1(H) 13.2(H) -  Hemoglobin 13.0 - 17.0 g/dL 12.8(L) 13.1 13.9  Hematocrit 39.0 - 52.0 % 37.1(L) 36.7(L) 41.0  Platelets 150 - 400 K/uL 134(L) 162 -   No results found for: HGBA1C  Lab Results  Component Value Date   TSH 0.566 04/16/2011   Lipid Panel     Component Value Date/Time   CHOL 165 04/16/2011 0700   TRIG 65 04/16/2011 0700   HDL 49 04/16/2011 0700   CHOLHDL 3.4 04/16/2011 0700   VLDL 13 04/16/2011 0700   LDLCALC 103 (H) 04/16/2011 0700      RADIOLOGY: No results found.  IMPRESSION: 1. Coronary artery disease involving native coronary artery of native heart without angina pectoris   2. Hyperlipidemia with target LDL less than 70   3. Elevated lipoprotein(a)   4. REM sleep behavior disorder     ASSESSMENT AND PLAN: Mr. Undrea Shipes is a 68 year old male who is 7  years following his acute coronary syndrome/inferior wall ST segment elevation myocardial infarction in 11/2839 which was complicated by recurrent episodes of ventricular fibrillation, cardiogenic shock which required multiple defibrillations. He has been demonstrated to have essentially complete salvage of myocardium in the RCA territory.  He did not have concomitant CAD with his other coronaries being normal.  His last nuclear stress test was in 2017 which remained normal.  He has continued to do well from a cardiac standpoint and remains asymptomatic.  His blood pressure today is well controlled on his regimen consisting of metoprolol 12.5 mg twice a day.  He is on maximum dose rosuvastatin now at 40 mg in addition to niacin and most recent LDL cholesterol had improved and in December 2019 was 71.  He has significant elevation of LP(a) and when checked 1 year ago was 148.  This had improved to 130 in July when rechecked by Dr. Virgina Jock and I will try to obtain the results of his most recent evaluation.  I discussed with him that this has evolved into an independent risk factor for CAD.  Trials are currently underway with new therapy.  Path of therapy has also been shown to be effective in approximately 26% reduction.  He continues to be active.  Recommending changing his metoprolol tartrate to metoprolol succinate and he will take 25 mg in the morning.  However if he  notes significant bradycardia with resting pulse is under 85 he will reduce the dose to 12.5 mg daily.  He continues to be followed by Dr. Brett Fairy for his REM sleep behavior disorder and symptoms have improved with Klonopin I will see him in 1 year for reevaluation or sooner if problems arise.  Time spent: 4mnutes  TTroy Sine MD, [facility] 07/27/2018 1:39 PM

## 2018-07-26 NOTE — Patient Instructions (Signed)
Medication Instructions:  STOP Metoprolol Tartrate START Metoprolol Succinate 25 mg daily.  If you need a refill on your cardiac medications before your next appointment, please call your pharmacy.   Follow-Up: At [name], you and your health needs are our priority.  As part of our continuing mission to provide you with exceptional heart care, we have created designated Provider Care Teams.  These Care Teams include your primary Cardiologist (physician) and Advanced Practice Providers (APPs -  Physician Assistants and Nurse Practitioners) who all work together to provide you with the care you need, when you need it. You will need a follow up appointment in 12 months.  Please call our office 2 months in advance to schedule this appointment.  You may see Dr.Kelly or one of the following Advanced Practice Providers on your designated Care Team: Azalee Course, [place] . Micah Flesher, PA-C  Any Other Special Instructions Will Be Listed Below (If Applicable). If you feel lightheaded, or have bradycardia below 43, cut Metoprolol dose in half.

## 2018-07-27 ENCOUNTER — Encounter: Payer: Self-pay | Admitting: Cardiovascular Disease

## 2018-08-02 ENCOUNTER — Other Ambulatory Visit: Payer: Self-pay | Admitting: Cardiovascular Disease

## 2018-08-02 ENCOUNTER — Ambulatory Visit
Admission: RE | Admit: 2018-08-02 | Discharge: 2018-08-02 | Disposition: A | Discharge status: Home / Self Care | Payer: Medicare Other | Source: Ambulatory Visit | Attending: Student | Admitting: Student

## 2018-08-02 ENCOUNTER — Other Ambulatory Visit: Payer: Self-pay | Admitting: Student

## 2018-08-02 DIAGNOSIS — M5440 Lumbago with sciatica, unspecified side: Secondary | ICD-10-CM

## 2018-08-02 MED ORDER — IOPAMIDOL (ISOVUE-M 200) INJECTION 41%: 1.0000 mL | Freq: Once | INTRAMUSCULAR | Status: DC

## 2018-08-02 MED ORDER — METHYLPREDNISOLONE ACETATE 40 MG/ML INJ SUSP (RADIOLOG: 120.0000 mg | Freq: Once | INTRAMUSCULAR | Status: DC

## 2018-08-02 NOTE — Discharge Instructions (Signed)

## 2018-10-01 ENCOUNTER — Other Ambulatory Visit: Payer: Self-pay | Admitting: Neurology

## 2018-11-15 ENCOUNTER — Other Ambulatory Visit: Payer: Self-pay | Admitting: Neurosurgery

## 2018-11-15 DIAGNOSIS — M5440 Lumbago with sciatica, unspecified side: Secondary | ICD-10-CM

## 2018-12-03 ENCOUNTER — Other Ambulatory Visit: Payer: Self-pay | Admitting: *Deleted

## 2018-12-03 MED ORDER — METOPROLOL TARTRATE 25 MG PO TABS: 12.5000 mg | ORAL_TABLET | Freq: Two times a day (BID) | ORAL | 3 refills | Status: DC

## 2018-12-20 ENCOUNTER — Telehealth: Payer: Self-pay | Admitting: Cardiovascular Disease

## 2018-12-20 NOTE — Telephone Encounter (Signed)
   [facility] Pre-operative Risk Assessment    Request for surgical clearance:  1. What type of surgery is being performed? L5/S lumbar microdiskectomy   2. When is this surgery scheduled? January 04, 2019   3. What type of clearance is required (medical clearance vs. Pharmacy clearance to hold med vs. Both)? Medical   4. Are there any medications that need to be held prior to surgery and how long? None specified    5. Practice name and name of physician performing surgery? Dr. Kary Kos with [facility]   6. What is your office phone number [phone]    7.   What is your office fax number [phone] (Attn: Lorriane Shire)  8.   Anesthesia type (None, local, MAC, general) ? General   [name] M 12/20/2018, 3:58 PM  _________________________________________________________________   (provider comments below)

## 2018-12-21 ENCOUNTER — Telehealth: Payer: Self-pay

## 2018-12-21 NOTE — Telephone Encounter (Signed)
Open In error. 

## 2018-12-21 NOTE — Telephone Encounter (Signed)
   Primary Cardiologist: Dr Tresa Endo  Chart reviewed and patient contacted today by phone as part of pre-operative protocol coverage. Given past medical history and time since last visit, based on ACC/AHA guidelines, [name] would be at acceptable risk for the planned procedure without further cardiovascular testing.   OK to hold aspirin 3-5 days pre op.   I will route this recommendation to the requesting party via Epic fax function and remove from pre-op pool.  Please call with questions.  Corine Shelter, PA-C 12/21/2018, 9:57 AM

## 2019-01-08 ENCOUNTER — Encounter: Payer: Self-pay | Admitting: Neurology

## 2019-02-13 ENCOUNTER — Encounter: Payer: Self-pay | Admitting: Neurology

## 2019-02-13 ENCOUNTER — Other Ambulatory Visit: Payer: Self-pay

## 2019-02-13 ENCOUNTER — Ambulatory Visit (INDEPENDENT_AMBULATORY_CARE_PROVIDER_SITE_OTHER): Payer: Medicare Other | Admitting: Neurology

## 2019-02-13 VITALS — BP 118/75 | HR 57 | Temp 97.3°F | Ht 72.0 in | Wt 193.0 lb

## 2019-02-13 DIAGNOSIS — G4752 REM sleep behavior disorder: Secondary | ICD-10-CM | POA: Diagnosis not present

## 2019-02-13 MED ORDER — CLONAZEPAM 0.5 MG PO TABS: ORAL_TABLET | ORAL | 5 refills | Status: DC

## 2019-02-13 NOTE — Progress Notes (Signed)
[facility] SLEEP MEDICINE CONSULT   Provider:  Melvyn Novasarmen  Ginni Eichler, M D  Referring Provider: [name], [name], MD Primary Care Physician:  [name], [name], MD  Chief Complaint  Patient presents with  . Follow-up    pt alone, rm 10. pt states things are going well.      Interval 02-13-2019, [name] Resende is a 68 y.o. caucasian, married, right handed male , [name] is a 68 year old right handed caucasian patient. Parasomnia remain controlled.  He had a flair up after surgery, when he had to take pain medication and muscle relaxants and he kicked "with a vengeance" while holding the Clonazepam. He has sciatica and still has some, L4-5 nerve was pinched.  He remains in pain, on the right. His MRI from yesterday showed swelling on the radix nervi.he will have a steroid injection in the next 2 weeks.  As he returned to clonazepam he immediately slept better and his REM BD/ night terrors have been controlled. He takes also tart cherry extract and tumeric.  BTW:he still works full time.    HPI: 02-07-2018  [name] Neels is a 68 y.o. caucasian, married, right handed male , who is seen here as a follow up after a sleep study. He is doing well, has not yet retired. 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 He sold his family home, the couple has an empty nest. Son lives in [place].  He has been sleeping OK. Night terrors are rare- and sleep is less disrupted. Klonopin 0.75 mg. Some nights he will have red wine, but will not take klonopin. He is still high cholesterol- Crestor increased. Routine visit.   01-2017 CD He advised me that he has not been diagnosed with Diverticulitis or hypertension as mentioned in his sleep study history .  Sleep sStudy was performed on 01-30-14. It documented a very mild AHI of 6.8 and an RDI of 6.8 supine sleep and REM stage and it accentuates the AHI REM AHI was 11.6 in supine AHI was 14 based on this my main suggestion to reduce snoring or apnea would be to  avoid the supine sleep position. The patient had 22 minutes total throughout the night of low oxygen levels the oxygen nadir was 88% which is not significant. He did have some trouble to stay asleep there were multiple episodes of REM behavior disorder nor does 1 was captured on video and was clearly understandable that the patient's poor blood and another episode in more detail,  that document of these behaviors. During REM sleep limb movement and his muscle tone was still activated confirming the diagnosis of REM behavior disorder.   As we have discussed in our previous visit there is a higher overlap of REM behavior disorder leading to Parkinson's disease. The patient had started taking Klonopin and has noticed a significant decrease in the frequency of salsa he is combined at night with melatonin, which seems to have complimented the Klonopin. Devin.Shupert reports having vivid nightmares, usually being chased or attacked by an animal. He usually moves in bed- but at least once left the bed and tried to close and block the door. He has banged on the pillow , accidentally hitting his wife, who can display the bruises here.  He reported having these dreams and acting out periods up to 4 times a night, and beginning much more infrequently the year before his heart attack in 2012.  He goes to bed around 11 PM and falls asleep promptly, his  bedroom is quiet , dark and cool, he shares the bed with his wife.  By 1.30 or 2 AM he has often the  first display of REM BD . He reported not having vivid nightmares for about 6 month after his heart attack, which could be related to medication changes. He goes to the bathroom 2-3 times , and he has a high fluid intake, not caffeine, no ETOH, never been a smoker. The rise time in the morning is 5.30, he wakes spontaneously. He may sleep until 6 AM on weekends. He does not have a headache, he feels refreshed and restored. He snores, but his wife has not noted any apnea. Snoring  is supine the loudest . He has a office with frosted glass window, and gets out of doors for lunch. 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   He has been hyperactive to some degree, always has  nervously moved his legs, tapped his feet.  No falls, no BP variability, no near syncope.   Interval history from 02-17-15. This is a yearly revisit, patient [name] Urbieta last seen in July 2015, patent of Dr Timothy Lassousso.  Devin. Gwenith SpitzMcKeen is followed here for REM behavior disorder we are requesting him to fill sleep related questionnaires as well as memory or cognitive tests. He endorsed the geriatric depression score at 2 points which is clinically deemed insignificant the Epworth sleepiness score at 7 which is a normal range and the fatigue severity score at 20 points also in normal range. He did excellent with the Montral cognitive assessment test. I prefer to apply this test over her Mini-Mental Status Examination due to the patient's age and level of education. The patient was well able to pass the trail making test, copied a three-dimensional cube image and to the clock face. He was also able to name 3 animals do the serial 7 subtraction the language obstruction and fluency test and he recalled 3 out of 5 recall words as well as being fully oriented to date time and place. The word fluency was 14 words beginning with the letter F. His score was 28 points out of a possible 30 and she lost 2 points to delayed recall. This is not indicative of significant short-term memory loss.  She reports no tremors no quivering voice, no falls or gait instability. No cogwheeling is noted. He also does not suffer from restless legs. The patient reports that his REM behavior disorder has not affected him significantly over the last 12 months he did have a bout of several nights about 4 months ago. However this has not recurred since and he states that it was several months before that about that he had any REM behavior that  his wife had witnessed. He remains of course  unawareof the disorder. He sometimes mumbles. No hitting and no thrashing. He reports that he cannot recall his visit dreams that he used to have before.  Interval history from 01/05/2016. Devin. Gwenith SpitzMcKeen is seen here today after a recent uptake in REM behavior activity. He reports that for a week or so he has been almost nightly engaged and acting out dreams but for the very last week prior to this visit the activity has ceased again. There is stress at work, he is 68 years old and feels unready to retire. He is ready to increase Klonopin.  Interval history 02-07-2017, I had the pleasure of seeing Devin. Gwenith SpitzMcKeen today who again scored entirely normal on a Montral cognitive assessment,  endorsed 25 points on the fatigue severity scale and 5 points on the Epworth Sleepiness Scale, and 3 out of 15 points in the geriatric  depression score. REM activity is controlled. Klonopin. He has a lot of stress, describes a burn out. 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    Review of Systems:  Out of a complete 14 system review, the patient complains of only the following symptoms, and all other reviewed systems are negative. See above.  How likely are you to doze in the following situations: 0 = not likely, 1 = slight chance, 2 = moderate chance, 3 = high chance  Sitting and Reading?1 Watching Television? 1 Sitting inactive in a public place (theater or meeting)? Lying down in the afternoon when circumstances permit?3 Sitting and talking to someone? Sitting quietly after lunch without alcohol?1 In a car, while stopped for a few minutes in traffic? As a passenger in a car for an hour without a break?  Total = 6/ 24      Social History   Socioeconomic History  . Marital status: Married    Spouse name: [name]  . Number of children: 1  . Years of education: Bachelor's  . Highest education level: Not on file  Occupational History  . Not on file   Social Needs  . Financial resource strain: Not on file  . Food insecurity    Worry: Not on file    Inability: Not on file  . Transportation needs    Medical: Not on file    Non-medical: Not on file  Tobacco Use  . Smoking status: Never Smoker  . Smokeless tobacco: Never Used  Substance and Sexual Activity  . Alcohol use: No    Alcohol/week: 0.0 standard drinks    Comment: rarely  . Drug use: No  . Sexual activity: Not on file  Lifestyle  . Physical activity    Days per week: Not on file    Minutes per session: Not on file  . Stress: Not on file  Relationships  . Social Musicianconnections    Talks on phone: Not on file    Gets together: Not on file    Attends religious service: Not on file    Active member of club or organization: Not on file    Attends meetings of clubs or organizations: Not on file    Relationship status: Not on file  . Intimate partner violence    Fear of current or ex partner: Not on file    Emotionally abused: Not on file    Physically abused: Not on file    Forced sexual activity: Not on file  Other Topics Concern  . Not on file  Social History Narrative   Patient is married ([name]) and lives at home with his wife.   Patient has one adult child.   Patient is working full-time.   Patient has a Bachelor's degree.   Patient is right-handed.   Patient drinks one cup of tea  daily.    Family History  Problem Relation Age of Onset  . Cancer Mother        esophagus  . Heart disease Mother   . Hypertension Mother   . Esophageal cancer Mother   . Cancer Father        spinal   . Colon cancer Neg Hx   . Rectal cancer Neg Hx   . Stomach cancer Neg Hx     Past Medical History:  Diagnosis Date  . CAD (coronary artery disease)    a. Inf MI/PCI: 100% pRCA (4.0x22 Integrity BMS->dil to 4.31mm)-->MI complicated by VF and CGS;  b. 04/2012 MV: EF 65%, inf attenuation, no ischemia.  . Colon polyps    adenomatous  . Diverticulosis   . Hyperlipidemia   .  Hypertensive heart disease   . RBD (REM behavioral disorder) 02/14/2014    Past Surgical History:  Procedure Laterality Date  . broken wrist    . COLONOSCOPY    . CORONARY STENT PLACEMENT     03/2011  . WISDOM TOOTH EXTRACTION      Current Outpatient Medications  Medication Sig Dispense Refill  . aspirin 81 MG tablet Take 81 mg by mouth daily.    . Cholecalciferol (VITAMIN D PO) Take by mouth.    . clonazePAM (KLONOPIN) 0.5 MG tablet TAKE 1.5 TABLETS BY MOUTH AT BEDTIME AS NEEDED 45 tablet 5  . gabapentin (NEURONTIN) 300 MG capsule     . metoprolol tartrate (LOPRESSOR) 25 MG tablet Take 0.5 tablets (12.5 mg total) by mouth 2 (two) times daily. 90 tablet 3  . Misc Natural Products (TART CHERRY ADVANCED) CAPS Take 1 capsule by mouth daily.    . niacin (NIASPAN) 500 MG CR tablet TAKE 1 TABLET BY MOUTH EVERY DAY AT BEDTIME 90 tablet 4  . rosuvastatin (CRESTOR) 40 MG tablet Take 1 tablet (40 mg total) by mouth daily.    . traMADol (ULTRAM) 50 MG tablet Take 50-100 mg by mouth every 6 (six) hours as needed.    . Turmeric 500 MG CAPS Take 1 capsule by mouth daily.     No current facility-administered medications for this visit.     Allergies as of 02/13/2019  . (No Known Allergies)    Vitals: BP 118/75   Pulse (!) 57   Temp (!) 97.3 F (36.3 C)   Ht 6' (1.829 m)   Wt 193 lb (87.5 kg)   BMI 26.18 kg/m  Last Weight:  Wt Readings from Last 1 Encounters:  02/13/19 193 lb (87.5 kg)   Last Height:   Ht Readings from Last 1 Encounters:  02/13/19 6' (1.829 m)    Physical exam:  General: The patient is awake, alert and appears not in acute distress. The patient is well groomed. Head: Normocephalic, atraumatic. Neck is supple. Mallampati 4  neck circumference: 15.5  Inches . He has TMJ click on the left, no delayed swallowing, no coughing.  Cardiovascular:  Regular rate and rhythm ,  without  murmurs or carotid bruit, and without distended neck veins. Respiratory: Lungs are clear  to auscultation. Skin:  Without evidence of edema, or rash Trunk:  Patient has normal posture.  Neurologic exam : The patient is awake and alert, oriented to place and time. Memory : performed 2016 a  MOCA with patient , who lost 2 of 5 words in recall. No repeat in 2017. Montreal Cognitive Assessment  02/07/2017  Visuospatial/ Executive (0/5) 5  Naming (0/3) 3  Attention: Read list of digits (0/2) 2  Attention: Read list of letters (0/1) 1  Attention: Serial 7 subtraction starting at 100 (0/3) 3  Language: Repeat phrase (0/2) 2  Language : Fluency (0/1) 1  Abstraction (0/2) 2  Delayed Recall (0/5) 2  Orientation (0/6) 5  Total 26     There is a normal attention span & concentration ability. He became tearful when he described his work situation.  Speech is fluent without  dysarthria, dysphonia or  aphasia. No hoarseness. Mood and affect are appropriate.  Cranial nerves: Pupils are equal and briskly reactive to light. Funduscopic exam deferred.   Floaters  In the right lens.  Extraocular movements  in vertical and horizontal planes intact and without nystagmus. Visual fields by finger perimetry are intact. Hearing to finger rub intact.  Facial sensation intact to fine touch. Facial motor strength is symmetric and tongue and uvula move midline. Motor exam:  Normal tone- no cog wheeling  , muscle bulk and symmetric normal strength in all extremities.  He has no longer cogwheel rigidity over the left biceps, not the wrist not the shoulder, this could related to a C5 radiculopathy.  Sensory:  No deficits. Coordination: Finger-to-nose maneuvernormal without evidence of ataxia, but there was mild dysmetria,  there was a mild action tremor in the right.  Gait and station: Patient walks without assistive device, steady and with normal armswing. Not stooped.  Strength within normal limits. Stance is stable and of normal base.  Tandem gait is intact, turns with 2 Steps, which  are  unfragmented. Romberg testing- no fall tendency  Deep tendon reflexes: in the  upper and lower extremities are symmetric and intact.   Assessment:  20 minutes -After physical and neurologic examination, review of laboratory studies, imaging, neurophysiology testing and pre-existing records, assessment is :  REM BD with nocturnal  kicking and nightmares. Confirmed REM BD in sleep study,  On betablocker- helps Tremor, anxiety.  No evidence of MCI.   , L 4-5 sciatica evaluated by Dr. Saintclair Halsted. Helped by an injection in lower back.   Plan:  Klonopin 0.5 mg nightly, prn use twice at night.   Yearly follow up for PD ear;ly signs, currently no cog-wheelig, no EOM saccades and no rare blink, normal voice, no masked face.  Continue medication, yearly RV for REM BD with MOCA.     Larey Seat, MD

## 2019-05-07 DIAGNOSIS — G5700 Lesion of sciatic nerve, unspecified lower limb: Secondary | ICD-10-CM | POA: Insufficient documentation

## 2019-05-17 ENCOUNTER — Telehealth: Payer: Self-pay

## 2019-05-17 ENCOUNTER — Other Ambulatory Visit: Payer: Self-pay | Admitting: Neurosurgery

## 2019-05-17 DIAGNOSIS — M5416 Radiculopathy, lumbar region: Secondary | ICD-10-CM

## 2019-05-17 NOTE — Telephone Encounter (Signed)
Phone call to patient to verify medication list and allergies for myelogram procedure. Pt instructed to hold Tramadol for 48hrs prior to myelogram appointment time. Pt verbalized understanding. Pt also instructed that he would be with [place] for about 2 hours, he would need to have a driver the day of his procedure, and he would have to go home and lay flat for 24 hours after procedure. Pt verbalized understanding. Pt advised to call [facility] imaging and speak to a nurse if any new medications were started before his procedure day.

## 2019-05-27 ENCOUNTER — Ambulatory Visit
Admission: RE | Admit: 2019-05-27 | Discharge: 2019-05-27 | Disposition: A | Discharge status: Home / Self Care | Payer: Medicare Other | Source: Ambulatory Visit | Attending: Neurosurgery | Admitting: Neurosurgery

## 2019-05-27 ENCOUNTER — Other Ambulatory Visit: Payer: Self-pay

## 2019-05-27 DIAGNOSIS — M5416 Radiculopathy, lumbar region: Secondary | ICD-10-CM

## 2019-05-27 MED ORDER — IOPAMIDOL (ISOVUE-M 200) INJECTION 41%
18.0000 mL | Freq: Once | INTRAMUSCULAR | Status: AC
  Administered 2019-05-27: 11:00:00 18 mL via INTRATHECAL

## 2019-05-27 MED ORDER — DIAZEPAM 5 MG PO TABS
5.0000 mg | ORAL_TABLET | Freq: Once | ORAL | Status: AC
  Administered 2019-05-27: 5 mg via ORAL

## 2019-05-27 NOTE — Progress Notes (Signed)
Patient states he has been off Tramadol for at least the past two days. 

## 2019-05-27 NOTE — Discharge Instructions (Signed)

## 2019-05-31 ENCOUNTER — Telehealth: Payer: Self-pay

## 2019-05-31 NOTE — Telephone Encounter (Signed)
   Primary Cardiologist: Shelva Majestic, MD  Chart reviewed as part of pre-operative protocol coverage. [name] was last seen on 07/26/18 by Dr. Claiborne Billings. Prior hx extensively outlined in that note, with CAD, VFib, cardiogenic shock, PCI in 2012, HLD, hypertensive heart disease. Last EF 50% by nuc in 2017 with no ischemia by that study.  Will route to Dr. Claiborne Billings for input on how long to hold ASA, then patient will need call. Prior clearance in 12/2018 by APP stated to hold 3-5 days so wanted to know if that duration was specifically per MD request. Dr. Claiborne Billings - - Please route response to P CV DIV PREOP (the pre-op pool). Thank you.   Charlie Pitter, PA-C 05/31/2019, 3:52 PM

## 2019-05-31 NOTE — Telephone Encounter (Signed)
   [facility] Pre-operative Risk Assessment    Request for surgical clearance:  1. What type of surgery is being performed? L 4-5 Lumbar Microdiskectomy   2. When is this surgery scheduled? TBD  3. What type of clearance is required Both  4. Are there any medications that need to be held prior to surgery and how long? Aspirin  5. Practice name and name of physician performing surgery? [facility]  6. What is your office phone number [phone]   7.   What is your office fax number [phone]  8.   Anesthesia type  General   Kathyrn Lass 05/31/2019, 2:54 PM  _________________________________________________________________   (provider comments below)

## 2019-06-05 NOTE — Telephone Encounter (Signed)
   Primary Cardiologist: Shelva Majestic, MD  Chart reviewed as part of pre-operative protocol coverage. Given past medical history and time since last visit, based on ACC/AHA guidelines, [name] would be at acceptable risk for the planned procedure without further cardiovascular testing.   Spoke with patient 06/05/2019 and he is doing excellent from a cardiac perspective, able to perform well over 4 METS without symptoms. Biggest complaint seems to be pain from his back.   Per Dr. Claiborne Billings, it will be acceptable to hold ASA for 5-7 days prior to procedure and resume when okay per surgical team. Pt has a follow up with Dr. Claiborne Billings 07/2019.   I will route this recommendation to the requesting party via Epic fax function and remove from pre-op pool.  Please call with questions.  Kathyrn Drown, NP 06/05/2019, 8:39 AM

## 2019-06-05 NOTE — Telephone Encounter (Signed)
Pt has been stable for many years; Ok to hold ASA for at least 5 -7days with neurosurgery

## 2019-07-30 ENCOUNTER — Encounter: Payer: Self-pay | Admitting: Cardiovascular Disease

## 2019-07-30 ENCOUNTER — Other Ambulatory Visit: Payer: Self-pay

## 2019-07-30 ENCOUNTER — Ambulatory Visit: Payer: Medicare Other | Admitting: Cardiovascular Disease

## 2019-07-30 VITALS — BP 102/64 | HR 62 | Temp 97.0°F | Ht 72.0 in | Wt 200.0 lb

## 2019-07-30 DIAGNOSIS — E7841 Elevated Lipoprotein(a): Secondary | ICD-10-CM

## 2019-07-30 DIAGNOSIS — E785 Hyperlipidemia, unspecified: Secondary | ICD-10-CM

## 2019-07-30 DIAGNOSIS — I252 Old myocardial infarction: Secondary | ICD-10-CM | POA: Diagnosis not present

## 2019-07-30 DIAGNOSIS — I2510 Atherosclerotic heart disease of native coronary artery without angina pectoris: Secondary | ICD-10-CM | POA: Diagnosis not present

## 2019-07-30 DIAGNOSIS — G4752 REM sleep behavior disorder: Secondary | ICD-10-CM

## 2019-07-30 NOTE — Patient Instructions (Signed)
Medication Instructions:  Your physician recommends that you continue on your current medications as directed. Please refer to the Current Medication list given to you today.  If you need a refill on your cardiac medications before your next appointment, please call your pharmacy.   Lab work: NONE  Testing/Procedures: NONE  Follow-Up: At [facility], you and your health needs are our priority.  As part of our continuing mission to provide you with exceptional heart care, we have created designated Provider Care Teams.  These Care Teams include your primary Cardiologist (physician) and Advanced Practice Providers (APPs -  Physician Assistants and Nurse Practitioners) who all work together to provide you with the care you need, when you need it. You may see Thomas Kelly, MD or one of the following Advanced Practice Providers on your designated Care Team:    Hao Meng, PA-C  Angela Duke, PA-C or   Krista Kroeger, PA-C  Your physician wants you to follow-up in: 1 year with Dr. Kelly     

## 2019-07-30 NOTE — Progress Notes (Signed)
Patient ID: [name], male   DOB: 09-17-1950, 69 y.o.   MRN: [id number]        HPI: [name] is a 69 y.o. male who presents for a 25 month cardiology evaluation.  [name] developed an acute coronary syndrome/ST segment elevation inferior wall myocardial infarction on 04/16/2011. At that time, he had never had any cardiac symptoms. Immediately upon arrival to the catheterization laboratory he developed recurrent episodes of ventricular fibrillation and required multiple defibrillations. At catheterization he was found to have total proximal RCA occlusion in a very dominant vessel. He was in cardiogenic shock initially had significant for wall motion abnormality. I performed successful intervention and ultimately placed a 4.0x22 mm integrity stent postdilated to 4.38 mm in a large proximal right coronary artery. Ejection fraction ultimately improved to approximately 50-55%. A nuclear perfusion study in October 2013 showed essentially complete salvage of myocardium. He was on Brilinta for approximately 15 months and ultimately this was discontinued and he has been on aspirin alone. Also has been on combination therapy with low-dose Niaspan Crestor in addition to low-dose beta blocker therapy.  Since I last saw him he has remained cardiac stable.  He denies recurrent chest pain.  There are no palpitations.  He denies any PND or orthopnea.  He is unaware of any episodes of tachycardia.  He denies presyncope or syncope.  He does admit to increased work related stress.  However, he is managing to exercise 5 days per week.  He sees Dr. Virgina Jock, for primary care .  Laboratory has been checked by Dr. Virgina Jock.  He  was diagnosed as having insomnia with a REM sleep behavior disorder associated with  vivid frightful dreams, thrashing with viscious animals.  He has seen Dr. Roddie Mc and was started on Klonopin with some improvement in his symptomatology.  When he was last seen by her, Klonopin dose was increased.   He now takes 1.5 mg at bedtime.  He underwent a four-year follow-up nuclear perfusion study on 11/24/2015, which remained normal.  There was mild inferolateral attenuation but no evidence for prior infarction or ischemia.  Ejection fraction was 50%.    I last saw him in November 2018 at which time he was remaining stable from a cardiovascular standpoint., He has had difficulty with right sciatica resulting from L4, L5 degenerative disc disease which was confirmed on MRI.  He has undergone 2 spinal injections over the past several months.  He is still working.  He denied any episodes of chest pain, PND, orthopnea or change in exercise tolerance.    I last saw him in January 2020 .at which time he continued to feel stable from a cardiac standpoint.  He was contemplating possible retirement at the end of the year.  .  He has continued to have some issues with sciatica for which he sees Dr. Saintclair Halsted and undergoes injections every 4 to 5 months.  He denies any recurrent anginal symptomatology.  In the past, he was found to have significant elevation of Lp(a) and when last checked by me in December 2018 LP(a) was significantly elevated at 148.  In July this apparently had been reduced to 130.  I last saw him, I recommended further titration of rosuvastatin.  He has continued to be on niacin.  He tells me Dr. Virgina Jock had just rechecked laboratory in December 2019  and he was told that his LPA was further reduced but he did not know the number and I have not received the laboratory.  Since I last saw him over the past year has continued to be stable from a cardiac standpoint.  He continued to have issues with his sciatica and underwent initial back surgery in June 2020 but required additional surgery in December 2020 performed by Dr. Saintclair Halsted involving L4-L5 bone spurs.  He saw Dr. Virgina Jock for a telemedicine visit in December 2020.  Laboratory revealed a total cholesterol 153, HDL 72, LDL 69 and triglycerides 58.  LP(a) was  not checked at this time but in the past this has been markedly elevated and had been improved somewhat with combination niacin plus rosuvastatin.  He has seen Dr. Asencion Partridge Dohmeier and continues to be stable and denies any further night terrors.  He has been previously felt to have possible unrelated parasomnias.  He is not a exhibited any signs of the potential for Parkinson's disease.  He has continued to do well with low-dose clonazepam.  He is now 6 weeks since his last back surgery.  Over the past several weeks he started to resume walking and this will increase once given clearance by Dr. Saintclair Halsted.  He denies recurrent anginal symptoms.  He denies palpitations presyncope or syncope.  He presents for yearly evaluation.  Past Medical History:  Diagnosis Date  . CAD (coronary artery disease)    a. Inf MI/PCI: 100% pRCA (4.0x22 Integrity BMS->dil to 1.74YC)-->XK complicated by VF and CGS;  b. 04/2012 MV: EF 65%, inf attenuation, no ischemia.  . Colon polyps    adenomatous  . Diverticulosis   . Hyperlipidemia   . Hypertensive heart disease   . RBD (REM behavioral disorder) 02/14/2014    Past Surgical History:  Procedure Laterality Date  . broken wrist    . COLONOSCOPY    . CORONARY STENT PLACEMENT     03/2011  . WISDOM TOOTH EXTRACTION      No Known Allergies  Current Outpatient Medications  Medication Sig Dispense Refill  . aspirin 81 MG tablet Take 81 mg by mouth daily.    . Cholecalciferol (VITAMIN D PO) Take by mouth.    . clonazePAM (KLONOPIN) 0.5 MG tablet TAKE 1.5 TABLETS BY MOUTH AT BEDTIME AS NEEDED 45 tablet 5  . Misc Natural Products (TART CHERRY ADVANCED) CAPS Take 1 capsule by mouth daily.    . niacin (NIASPAN) 500 MG CR tablet TAKE 1 TABLET BY MOUTH EVERY DAY AT BEDTIME 90 tablet 4  . rosuvastatin (CRESTOR) 40 MG tablet Take 1 tablet (40 mg total) by mouth daily.    . Turmeric 500 MG CAPS Take 1 capsule by mouth daily.    . metoprolol tartrate (LOPRESSOR) 25 MG tablet Take  0.5 tablets (12.5 mg total) by mouth 2 (two) times daily. 90 tablet 3   No current facility-administered medications for this visit.    Social History   Socioeconomic History  . Marital status: Married    Spouse name: [name]  . Number of children: 1  . Years of education: Bachelor's  . Highest education level: Not on file  Occupational History  . Not on file  Tobacco Use  . Smoking status: Never Smoker  . Smokeless tobacco: Never Used  Substance and Sexual Activity  . Alcohol use: No    Alcohol/week: 0.0 standard drinks    Comment: rarely  . Drug use: No  . Sexual activity: Not on file  Other Topics Concern  . Not on file  Social History Narrative   Patient is married ([name]) and lives at home with his wife.  Patient has one adult child.   Patient is working full-time.   Patient has a Bachelor's degree.   Patient is right-handed.   Patient drinks one cup of tea  daily.   Social Determinants of Health   Financial Resource Strain:   . Difficulty of Paying Living Expenses: Not on file  Food Insecurity:   . Worried About Charity fundraiser in the Last Year: Not on file  . Ran Out of Food in the Last Year: Not on file  Transportation Needs:   . Lack of Transportation (Medical): Not on file  . Lack of Transportation (Non-Medical): Not on file  Physical Activity:   . Days of Exercise per Week: Not on file  . Minutes of Exercise per Session: Not on file  Stress:   . Feeling of Stress : Not on file  Social Connections:   . Frequency of Communication with Friends and Family: Not on file  . Frequency of Social Gatherings with Friends and Family: Not on file  . Attends Religious Services: Not on file  . Active Member of Clubs or Organizations: Not on file  . Attends Archivist Meetings: Not on file  . Marital Status: Not on file  Intimate Partner Violence:   . Fear of Current or Ex-Partner: Not on file  . Emotionally Abused: Not on file  . Physically Abused:  Not on file  . Sexually Abused: Not on file   Socially, he is married.  He works in Engineer, technical sales.  He is one child.  He exercises 5 days per week.  There is no tobacco use  Family History  Problem Relation Age of Onset  . Cancer Mother        esophagus  . Heart disease Mother   . Hypertension Mother   . Esophageal cancer Mother   . Cancer Father        spinal   . Colon cancer Neg Hx   . Rectal cancer Neg Hx   . Stomach cancer Neg Hx    ROS General: Negative; No fevers, chills, or night sweats;  HEENT: Negative; No changes in vision or hearing, sinus congestion, difficulty swallowing Pulmonary: Negative; No cough, wheezing, shortness of breath, hemoptysis Cardiovascular: Negative; No chest pain, presyncope, syncope, palpitations GI: Negative; No nausea, vomiting, diarrhea, or abdominal pain GU: Negative; No dysuria, hematuria, or difficulty voiding Musculoskeletal: Status post back surgery secondary to sciatica June 2020 in December 2020 Hematologic/Oncology: Negative; no easy bruising, bleeding Endocrine: Negative; no heat/cold intolerance; no diabetes Neuro: Negative; no changes in balance, headaches Skin: Negative; No rashes or skin lesions Psychiatric: Negative; No behavioral problems, depression Sleep: Positive for REM sleep behavior disorder/parasomnia; No snoring, daytime sleepiness, hypersomnolence, bruxism, restless legs, hypnogognic hallucinations, no cataplexy Other comprehensive 14 point system review is negative.   PE BP 102/64 (BP Location: Left Arm, Patient Position: Sitting, Cuff Size: Normal)   Pulse 62   Temp (!) 97 F (36.1 C)   Ht 6' (1.829 m)   Wt 200 lb (90.7 kg)   BMI 27.12 kg/m    Repeat blood pressure by me was 120/68.  Wt Readings from Last 3 Encounters:  07/30/19 200 lb (90.7 kg)  02/13/19 193 lb (87.5 kg)  07/26/18 199 lb 3.2 oz (90.4 kg)   General: Alert, oriented, no distress.  Skin: normal turgor, no rashes, warm and dry HEENT: Normocephalic,  atraumatic. Pupils equal round and reactive to light; sclera anicteric; extraocular muscles intact;  Nose without nasal septal hypertrophy Mouth/Parynx  benign; Mallinpatti scale 2 Neck: No JVD, no carotid bruits; normal carotid upstroke Lungs: clear to ausculatation and percussion; no wheezing or rales Chest wall: without tenderness to palpitation Heart: PMI not displaced, RRR, s1 s2 normal, no definitive systolic murmur, no diastolic murmur, no rubs, gallops, thrills, or heaves Abdomen: soft, nontender; no hepatosplenomehaly, BS+; abdominal aorta nontender and not dilated by palpation. Back: no CVA tenderness Pulses 2+ Musculoskeletal: full range of motion, normal strength, no joint deformities Extremities: no clubbing cyanosis or edema, Homan's sign negative  Neurologic: grossly nonfocal; Cranial nerves grossly wnl Psychologic: Normal mood and affect  ECG (independently read by me): Normal sinus rhythm at 62 bpm.  Small nondiagnostic inferior Q waves with well-preserved R waves suggesting salvage of myocardium.  January 2020 ECG (independently read by me): Sinus Bradycardia at 47; Small inferiior Q waves with preserved R waves; normal intervals  June 07, 2018 ECG (independently read by me): Sinus bradycardia with PAC.  Normal intervals per no ST seg  November 2017 ECG (independently read by me): Sinus bradycardia 54 bpm.  ECG shows small inferior Q waves with preserved R waves.  May 2016 ECG (independently read by me): Sinus bradycardia 49 bpm.  No ectopy.  Normal intervals.  No ST segment changes.  April 2015 ECG today (Independently read by me): Sinus rhythm at 58 beats per minute.  No significant ST changes.  No evidence for prior myocardial infarction.  Prior 04/25/2013: ECG: Sinus bradycardia at 53 beats per minute. Normal. Very diminutive nondiagnostic Q waves in II, III, and F with preserved R waves concordant with his myocardial salvage   LABS: BMP Latest Ref Rng & Units  04/19/2011 04/17/2011 04/16/2011  Glucose 70 - 99 mg/dL 92 104(H) 140(H)  BUN 6 - 23 mg/dL _0 Creatinine 0.50 - 1.35 mg/dL 0.73 0.69 0.73  Sodium 135 - 145 mEq/L 137 135 136  Potassium 3.5 - 5.1 mEq/L 3.7 3.7 4.4  Chloride 96 - 112 mEq/L 104 105 107  CO2 19 - 32 mEq/L _1 Calcium 8.4 - 10.5 mg/dL 9.0 8.3(L) 7.9(L)   Hepatic Function Latest Ref Rng & Units 04/16/2011 04/16/2011  Total Protein 6.0 - 8.3 g/dL 5.7(L) 6.1  Albumin 3.5 - 5.2 g/dL 3.2(L) 3.6  AST 0 - 37 U/L 89(H) 14  ALT 0 - 53 U/L 18 11  Alk Phosphatase 39 - 117 U/L 60 64  Total Bilirubin 0.3 - 1.2 mg/dL 0.4 0.4   CBC Latest Ref Rng & Units 04/17/2011 04/16/2011 04/16/2011  WBC 4.0 - 10.5 K/uL 11.1(H) 13.2(H) -  Hemoglobin 13.0 - 17.0 g/dL 12.8(L) 13.1 13.9  Hematocrit 39.0 - 52.0 % 37.1(L) 36.7(L) 41.0  Platelets 150 - 400 K/uL 134(L) 162 -   No results found for: HGBA1C  Lab Results  Component Value Date   TSH 0.566 04/16/2011   Lipid Panel     Component Value Date/Time   CHOL 165 04/16/2011 0700   TRIG 65 04/16/2011 0700   HDL 49 04/16/2011 0700   CHOLHDL 3.4 04/16/2011 0700   VLDL 13 04/16/2011 0700   LDLCALC 103 (H) 04/16/2011 0700      RADIOLOGY: No results found.  IMPRESSION: 1. Coronary artery disease involving native coronary artery of native heart without angina pectoris   2. Old MI (myocardial infarction)   3. Hyperlipidemia with target LDL less than 70   4. Elevated lipoprotein(a)   5. REM sleep behavior disorder     ASSESSMENT AND PLAN: [name]  is a 69 year-old male who is 8 years following his acute coronary syndrome/inferior wall ST segment elevation myocardial infarction in 11/4980 which was complicated by recurrent episodes of ventricular fibrillation, cardiogenic shock which required multiple defibrillations. He has been demonstrated to have essentially complete salvage of myocardium in the RCA territory.  He did not have concomitant CAD with his other coronaries being  normal.  His last nuclear stress test was in 2017 which remained normal.  He has continued to do well from a cardiac standpoint and remains asymptomatic.  His blood pressure today remains excellent on his medical regimen consisting of metoprolol tartrate 12.5 mg twice a day.  Most recent lipid studies are stable with LDL cholesterol at 69 on rosuvastatin 40 mg in addition to low-dose niacin.  He has been on niacin also for its potential benefit in his previously documented markedly elevated LP(a).  Physical examination does not suggest any aortic stenosis which also has been implicated with an increased LP(a) level.  He tolerated his 2 back surgeries without cardiovascular compromise.  He has previously documented rem sleep behavior disorder controlled with Klonopin pin.  He has been over 5 years since diagnosis.  He denies any further night terrors.  There are no signs of potential Parkinson's since disease which has been potentially implicated in this disorder presently he will continue his current therapy.  He will increase his activity again as tolerated from a neurosurgical standpoint.  As long as he remains stable I will see him in 1 year for follow-up cardiology evaluation.  Time spent: 25 minutes Troy Sine, MD, [facility]  07/30/2019 9:15 AM

## 2019-08-13 ENCOUNTER — Ambulatory Visit: Payer: Medicare PPO

## 2019-08-21 ENCOUNTER — Other Ambulatory Visit: Payer: Self-pay | Admitting: Cardiovascular Disease

## 2019-08-23 ENCOUNTER — Ambulatory Visit: Payer: Medicare PPO

## 2019-09-04 ENCOUNTER — Other Ambulatory Visit: Payer: Self-pay | Admitting: Neurology

## 2019-09-04 MED ORDER — CLONAZEPAM 0.5 MG PO TABS: ORAL_TABLET | ORAL | 5 refills | Status: DC

## 2019-10-28 ENCOUNTER — Other Ambulatory Visit: Payer: Self-pay | Admitting: Cardiovascular Disease

## 2020-02-19 ENCOUNTER — Encounter: Payer: Self-pay | Admitting: Neurology

## 2020-02-19 ENCOUNTER — Ambulatory Visit: Payer: Medicare PPO | Admitting: Neurology

## 2020-02-19 VITALS — BP 110/73 | HR 58 | Ht 72.0 in | Wt 194.0 lb

## 2020-02-19 DIAGNOSIS — G4752 REM sleep behavior disorder: Secondary | ICD-10-CM

## 2020-02-19 MED ORDER — CLONAZEPAM 0.5 MG PO TABS: ORAL_TABLET | ORAL | 5 refills | Status: DC

## 2020-02-19 NOTE — Progress Notes (Signed)
[facility] SLEEP MEDICINE CONSULT   Provider:  Melvyn Novasarmen  Rielly Brunn, M D  Referring Provider: [name], [name], MD Primary Care Physician:  [name], [name], MD  Chief Complaint  Patient presents with  . Follow-up    pt alone, em 10. presents today for yearly follow up. states no issues or concerns.     Interval history - 02-19-2020: He will be officially retired as of 07-17-2020. He has been at peace with his decision and is looking forward to new goals and interests. 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 .  No changes in sleep pattern and no tremor, or any PD related symptoms.    Interval 02-13-2019, [name] is a 69 y.o. caucasian, married, right handed male , Mr [name] is a 69 year old right handed caucasian patient. Parasomnia remain controlled.  He had a flair up after surgery, when he had to take pain medication and muscle relaxants and he kicked "with a vengeance" while holding the Clonazepam. He has sciatica and still has some, L4-5 nerve was pinched.  He remains in pain, on the right. His MRI from yesterday showed swelling on the radix nervi.he will have a steroid injection in the next 2 weeks.  As he returned to clonazepam he immediately slept better and his REM BD/ night terrors have been controlled. He takes also tart cherry extract and tumeric.  BTW:he still works full time.    HPI: 02-07-2018  [name] is a 69 y.o. caucasian, married, right handed male , who is seen here as a follow up after a sleep study. He is doing well, has not yet retired. 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 He sold his family home, the couple has an empty nest. [place] in [place].  He has been sleeping OK. Night terrors are rare- and sleep is less disrupted. Klonopin 0.75 mg. Some nights he will have red wine, but will not take klonopin. He is still high cholesterol- Crestor increased. Routine visit.   01-2017 CD He advised me that he has not been  diagnosed with Diverticulitis or hypertension as mentioned in his sleep study history .  Sleep sStudy was performed on 01-30-14. It documented a very mild AHI of 6.8 and an RDI of 6.8 supine sleep and REM stage and it accentuates the AHI REM AHI was 11.6 in supine AHI was 14 based on this my main suggestion to reduce snoring or apnea would be to avoid the supine sleep position. The patient had 22 minutes total throughout the night of low oxygen levels the oxygen nadir was 88% which is not significant. He did have some trouble to stay asleep there were multiple episodes of REM behavior disorder nor does 1 was captured on video and was clearly understandable that the patient's poor blood and another episode in more detail,  that document of these behaviors. During REM sleep limb movement and his muscle tone was still activated confirming the diagnosis of REM behavior disorder.   As we have discussed in our previous visit there is a higher overlap of REM behavior disorder leading to Parkinson's disease. The patient had started taking Klonopin and has noticed a significant decrease in the frequency of salsa he is combined at night with melatonin, which seems to have complimented the Klonopin. [name] reports having vivid nightmares, usually being chased or attacked by an animal. He usually moves in bed- but at least once left the bed and tried to close and block  the door. He has banged on the pillow , accidentally hitting his wife, who can display the bruises here.  He reported having these dreams and acting out periods up to 4 times a night, and beginning much more infrequently the year before his heart attack in 2012.  He goes to bed around 11 PM and falls asleep promptly, his bedroom is quiet , dark and cool, he shares the bed with his wife.  By 1.30 or 2 AM he has often the  first display of REM BD . He reported not having vivid nightmares for about 6 month after his heart attack, which could be related to  medication changes. He goes to the bathroom 2-3 times , and he has a high fluid intake, not caffeine, no ETOH, never been a smoker. The rise time in the morning is 5.30, he wakes spontaneously. He may sleep until 6 AM on weekends. He does not have a headache, he feels refreshed and restored. He snores, but his wife has not noted any apnea. Snoring is supine the loudest . He has a office with frosted glass window, and gets out of doors for lunch. 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   He has been hyperactive to some degree, always has  nervously moved his legs, tapped his feet.  No falls, no BP variability, no near syncope.   Interval history from 02-17-15. This is a yearly revisit, patient [name] last seen in July 2015, patent of Dr Timothy Lasso.  [name] is followed here for REM behavior disorder we are requesting him to fill sleep related questionnaires as well as memory or cognitive tests. He endorsed the geriatric depression score at 2 points which is clinically deemed insignificant the Epworth sleepiness score at 7 which is a normal range and the fatigue severity score at 20 points also in normal range. He did excellent with the Montral cognitive assessment test. I prefer to apply this test over her Mini-Mental Status Examination due to the patient's age and level of education. The patient was well able to pass the trail making test, copied a three-dimensional cube image and to the clock face. He was also able to name 3 animals do the serial 7 subtraction the language obstruction and fluency test and he recalled 3 out of 5 recall words as well as being fully oriented to date time and place. The word fluency was 14 words beginning with the letter F. His score was 28 points out of a possible 30 and she lost 2 points to delayed recall. This is not indicative of significant short-term memory loss.  She reports no tremors no quivering voice, no falls or gait instability. No cogwheeling  is noted. He also does not suffer from restless legs. The patient reports that his REM behavior disorder has not affected him significantly over the last 12 months he did have a bout of several nights about 4 months ago. However this has not recurred since and he states that it was several months before that about that he had any REM behavior that his wife had witnessed. He remains of course  unawareof the disorder. He sometimes mumbles. No hitting and no thrashing. He reports that he cannot recall his visit dreams that he used to have before.  Interval history from 01/05/2016. [name] is seen here today after a recent uptake in REM behavior activity. He reports that for a week or so he has been almost nightly engaged and acting  out dreams but for the very last week prior to this visit the activity has ceased again. There is stress at work, he is 69 years old and feels unready to retire. He is ready to increase Klonopin.  Interval history 02-07-2017, I had the pleasure of seeing [name] today who again scored entirely normal on a Montral cognitive assessment, endorsed 25 points on the fatigue severity scale and 5 points on the Epworth Sleepiness Scale, and 3 out of 15 points in the geriatric  depression score. REM activity is controlled. Klonopin. He has a lot of stress, describes a burn out. 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    Review of Systems:  Out of a complete 14 system review, the patient complains of only the following symptoms, and all other reviewed systems are negative. See above.  How likely are you to doze in the following situations: 0 = not likely, 1 = slight chance, 2 = moderate chance, 3 = high chance  Sitting and Reading?1 Watching Television? 1 Sitting inactive in a public place (theater or meeting)? Lying down in the afternoon when circumstances permit?3 Sitting and talking to someone? Sitting quietly after lunch without alcohol?1 In a car, while  stopped for a few minutes in traffic? As a passenger in a car for an hour without a break?  Total = 6/ 24      Social History   Socioeconomic History  . Marital status: Married    Spouse name: Camelia Eng  . Number of children: 1  . Years of education: Bachelor's  . Highest education level: Not on file  Occupational History  . Not on file  Tobacco Use  . Smoking status: Never Smoker  . Smokeless tobacco: Never Used  Substance and Sexual Activity  . Alcohol use: No    Alcohol/week: 0.0 standard drinks    Comment: rarely  . Drug use: No  . Sexual activity: Not on file  Other Topics Concern  . Not on file  Social History Narrative   Patient is married ([name]) and lives at home with his wife.   Patient has one adult child.   Patient is working full-time.   Patient has a Bachelor's degree.   Patient is right-handed.   Patient drinks one cup of tea  daily.   Social Determinants of Health   Financial Resource Strain:   . Difficulty of Paying Living Expenses:   Food Insecurity:   . Worried About Programme researcher, broadcasting/film/video in the Last Year:   . Barista in the Last Year:   Transportation Needs:   . Freight forwarder (Medical):   Marland Kitchen Lack of Transportation (Non-Medical):   Physical Activity:   . Days of Exercise per Week:   . Minutes of Exercise per Session:   Stress:   . Feeling of Stress :   Social Connections:   . Frequency of Communication with Friends and Family:   . Frequency of Social Gatherings with Friends and Family:   . Attends Religious Services:   . Active Member of Clubs or Organizations:   . Attends Banker Meetings:   Marland Kitchen Marital Status:   Intimate Partner Violence:   . Fear of Current or Ex-Partner:   . Emotionally Abused:   Marland Kitchen Physically Abused:   . Sexually Abused:     Family History  Problem Relation Age of Onset  . Cancer Mother        esophagus  . Heart  disease Mother   . Hypertension Mother   . Esophageal cancer Mother   .  Cancer Father        spinal   . Colon cancer Neg Hx   . Rectal cancer Neg Hx   . Stomach cancer Neg Hx     Past Medical History:  Diagnosis Date  . CAD (coronary artery disease)    a. Inf MI/PCI: 100% pRCA (4.0x22 Integrity BMS->dil to 4.65mm)-->MI complicated by VF and CGS;  b. 04/2012 MV: EF 65%, inf attenuation, no ischemia.  . Colon polyps    adenomatous  . Diverticulosis   . Hyperlipidemia   . Hypertensive heart disease   . RBD (REM behavioral disorder) 02/14/2014    Past Surgical History:  Procedure Laterality Date  . broken wrist    . COLONOSCOPY    . CORONARY STENT PLACEMENT     03/2011  . WISDOM TOOTH EXTRACTION      Current Outpatient Medications  Medication Sig Dispense Refill  . aspirin 81 MG tablet Take 81 mg by mouth daily.    . Cholecalciferol (VITAMIN D PO) Take by mouth.    . clonazePAM (KLONOPIN) 0.5 MG tablet TAKE 1.5 TABLETS BY MOUTH AT BEDTIME AS NEEDED 45 tablet 5  . metoprolol tartrate (LOPRESSOR) 25 MG tablet TAKE 1/2 TABLET BY MOUTH TWICE A DAY 90 tablet 3  . Misc Natural Products (TART CHERRY ADVANCED) CAPS Take 1 capsule by mouth daily.    . niacin (NIASPAN) 500 MG CR tablet Take 1 tablet (500 mg total) by mouth at bedtime. 90 tablet 3  . rosuvastatin (CRESTOR) 40 MG tablet Take 1 tablet (40 mg total) by mouth daily.    . Turmeric 500 MG CAPS Take 1 capsule by mouth daily.     No current facility-administered medications for this visit.    Allergies as of 02/19/2020  . (No Known Allergies)    Vitals: BP 110/73   Pulse (!) 58   Ht 6' (1.829 m)   Wt 194 lb (88 kg)   BMI 26.31 kg/m  Last Weight:  Wt Readings from Last 1 Encounters:  02/19/20 194 lb (88 kg)   Last Height:   Ht Readings from Last 1 Encounters:  02/19/20 6' (1.829 m)    Physical exam:  General: The patient is awake, alert and appears not in acute distress. The patient is well groomed. Head: Normocephalic, atraumatic. Neck is supple. Mallampati 4  neck circumference:  15.5  Inches . He has TMJ click on the left, no delayed swallowing, no coughing.  Cardiovascular:  Regular rate and rhythm ,  without  murmurs or carotid bruit, and without distended neck veins. Respiratory: Lungs are clear to auscultation. Skin:  Without evidence of edema, or rash Trunk:  Patient has normal posture.  Neurologic exam : The patient is awake and alert, oriented to place and time. Memory : performed 2016 a  MOCA with patient , who lost 2 of 5 words in recall. No repeat in 2017. Montreal Cognitive Assessment  02/07/2017  Visuospatial/ Executive (0/5) 5  Naming (0/3) 3  Attention: Read list of digits (0/2) 2  Attention: Read list of letters (0/1) 1  Attention: Serial 7 subtraction starting at 100 (0/3) 3  Language: Repeat phrase (0/2) 2  Language : Fluency (0/1) 1  Abstraction (0/2) 2  Delayed Recall (0/5) 2  Orientation (0/6) 5  Total 26     There is a normal attention span & concentration ability. He became tearful when he  described his work situation.  Speech is fluent without  dysarthria, dysphonia or aphasia. No hoarseness. Mood and affect are appropriate.  Cranial nerves: Pupils are equal and briskly reactive to light. Funduscopic exam deferred. Floaters in the right lens.  Extraocular movements  in vertical and horizontal planes intact and without nystagmus. Visual fields by finger perimetry are intact. Facial motor strength is symmetric and tongue and uvula move midline. Motor exam:  Normal tone- no cogwheeling , muscle bulk and symmetric normal strength in all extremities.  (He has no longer cogwheel rigidity over the left biceps, not the wrist not the shoulder, this could have been related to a C5 radiculopathy).  Sensory:  No deficits. Coordination: Finger-to-nose maneuvernormal without evidence of ataxia, but there was mild dysmetria,  there was a mild action tremor in the right. None at rest.  Gait and station: Patient walks without assistive device, steady  and with normal armswing. Not stooped.  Strength within normal limits. Stance is stable and of normal base.  Tandem gait is intact, turns with 2 Steps, which are unfragmented.  Romberg testing- no fall tendency  Deep tendon reflexes: in the upper and lower extremities are symmetric and intact.   Assessment:  20 minutes  Routine interview and examination- After physical and neurologic examination, review of laboratory studies, imaging, neurophysiology testing and pre-existing records, assessment is :  REM BD with nocturnal  kicking and nightmares. Confirmed REM BD in sleep study,  On betablocker- helps Tremor, anxiety.  No evidence of MCI.   PS:  L 4-5 sciatica evaluated by Dr. Wynetta Emery. Helped by an injection in lower back.   Plan:  Klonopin 0.5 mg nightly, prn use twice at night.   Yearly follow up for PD early signs, currently no cog-wheelig, no EOM saccades and no rare blink, normal voice, no masked face.  Continue medication, yearly RV for REM BD with MOCA.     Melvyn Novas, MD

## 2020-06-25 DIAGNOSIS — Z125 Encounter for screening for malignant neoplasm of prostate: Secondary | ICD-10-CM | POA: Diagnosis not present

## 2020-06-25 DIAGNOSIS — E785 Hyperlipidemia, unspecified: Secondary | ICD-10-CM | POA: Diagnosis not present

## 2020-07-02 DIAGNOSIS — G4752 REM sleep behavior disorder: Secondary | ICD-10-CM | POA: Diagnosis not present

## 2020-07-02 DIAGNOSIS — R972 Elevated prostate specific antigen [PSA]: Secondary | ICD-10-CM | POA: Diagnosis not present

## 2020-07-02 DIAGNOSIS — R82998 Other abnormal findings in urine: Secondary | ICD-10-CM | POA: Diagnosis not present

## 2020-07-02 DIAGNOSIS — M1990 Unspecified osteoarthritis, unspecified site: Secondary | ICD-10-CM | POA: Diagnosis not present

## 2020-07-02 DIAGNOSIS — Z1212 Encounter for screening for malignant neoplasm of rectum: Secondary | ICD-10-CM | POA: Diagnosis not present

## 2020-07-02 DIAGNOSIS — Z9861 Coronary angioplasty status: Secondary | ICD-10-CM | POA: Diagnosis not present

## 2020-07-02 DIAGNOSIS — Z0000 Encounter for general adult medical examination without abnormal findings: Secondary | ICD-10-CM | POA: Diagnosis not present

## 2020-07-02 DIAGNOSIS — F4322 Adjustment disorder with anxiety: Secondary | ICD-10-CM | POA: Diagnosis not present

## 2020-07-02 DIAGNOSIS — E785 Hyperlipidemia, unspecified: Secondary | ICD-10-CM | POA: Diagnosis not present

## 2020-07-02 DIAGNOSIS — I2510 Atherosclerotic heart disease of native coronary artery without angina pectoris: Secondary | ICD-10-CM | POA: Diagnosis not present

## 2020-07-02 DIAGNOSIS — I252 Old myocardial infarction: Secondary | ICD-10-CM | POA: Diagnosis not present

## 2020-09-11 ENCOUNTER — Other Ambulatory Visit: Payer: Self-pay | Admitting: Neurology

## 2020-10-13 ENCOUNTER — Ambulatory Visit: Payer: Medicare PPO | Admitting: Cardiovascular Disease

## 2020-10-13 ENCOUNTER — Other Ambulatory Visit: Payer: Self-pay

## 2020-10-13 ENCOUNTER — Encounter: Payer: Self-pay | Admitting: Cardiovascular Disease

## 2020-10-13 VITALS — BP 138/70 | HR 49 | Ht 72.0 in | Wt 200.0 lb

## 2020-10-13 DIAGNOSIS — G4752 REM sleep behavior disorder: Secondary | ICD-10-CM

## 2020-10-13 DIAGNOSIS — E7841 Elevated Lipoprotein(a): Secondary | ICD-10-CM | POA: Diagnosis not present

## 2020-10-13 DIAGNOSIS — I2510 Atherosclerotic heart disease of native coronary artery without angina pectoris: Secondary | ICD-10-CM | POA: Diagnosis not present

## 2020-10-13 DIAGNOSIS — E785 Hyperlipidemia, unspecified: Secondary | ICD-10-CM | POA: Diagnosis not present

## 2020-10-13 DIAGNOSIS — I252 Old myocardial infarction: Secondary | ICD-10-CM

## 2020-10-13 NOTE — Patient Instructions (Signed)

## 2020-10-13 NOTE — Progress Notes (Signed)
Patient ID: [name], male   DOB: October 07, 1950, 69 y.o.   MRN: [id number]        HPI: [name] is a 70 y.o. male who presents for a 36 month cardiology evaluation.  [name] developed an acute coronary syndrome/ST segment elevation inferior wall myocardial infarction on 04/16/2011. At that time, he had never had any cardiac symptoms. Immediately upon arrival to the catheterization laboratory he developed recurrent episodes of ventricular fibrillation and required multiple defibrillations. At catheterization he was found to have total proximal RCA occlusion in a very dominant vessel. He was in cardiogenic shock initially had significant for wall motion abnormality. I performed successful intervention and ultimately placed a 4.0x22 mm integrity stent postdilated to 4.38 mm in a large proximal right coronary artery. Ejection fraction ultimately improved to approximately 50-55%. A nuclear perfusion study in October 2013 showed essentially complete salvage of myocardium. He was on Brilinta for approximately 15 months and ultimately this was discontinued and he has been on aspirin alone. Also has been on combination therapy with low-dose Niaspan Crestor in addition to low-dose beta blocker therapy.  Since I  saw him he has remained cardiac stable.  He denies recurrent chest pain.  There are no palpitations.  He denies any PND or orthopnea.  He is unaware of any episodes of tachycardia.  He denies presyncope or syncope.  He does admit to increased work related stress.  However, he is managing to exercise 5 days per week.  He sees Dr. Virgina Jock, for primary care .  Laboratory has been checked by Dr. Virgina Jock.  He  was diagnosed as having insomnia with a REM sleep behavior disorder associated with  vivid frightful dreams, thrashing with viscious animals.  He has seen Dr. Roddie Mc and was started on Klonopin with some improvement in his symptomatology.  When he was last seen by her, Klonopin dose was increased.  He  now takes 1.5 mg at bedtime.  He underwent a four-year follow-up nuclear perfusion study on 11/24/2015, which remained normal.  There was mild inferolateral attenuation but no evidence for prior infarction or ischemia.  Ejection fraction was 50%.    I saw him in November 2018 at which time he was remaining stable from a cardiovascular standpoint., He has had difficulty with right sciatica resulting from L4, L5 degenerative disc disease which was confirmed on MRI.  He has undergone 2 spinal injections over the past several months.  He is still working.  He denied any episodes of chest pain, PND, orthopnea or change in exercise tolerance.    I saw him in January 2020 .at which time he continued to feel stable from a cardiac standpoint.  He was contemplating possible retirement at the end of the year.  .  He has continued to have some issues with sciatica for which he sees Dr. Saintclair Halsted and undergoes injections every 4 to 5 months.  He denies any recurrent anginal symptomatology.  In the past, he was found to have significant elevation of Lp(a) and when last checked by me in December 2018 LP(a) was significantly elevated at 148.  In July this apparently had been reduced to 130.  I last saw him, I recommended further titration of rosuvastatin.  He has continued to be on niacin.  He tells me Dr. Virgina Jock had just rechecked laboratory in December 2019  and he was told that his LPA was further reduced but he did not know the number and I have not received the laboratory.  He was last evaluated by me in January 2021 and since his prior evaluation he continued to be stable from a cardiac standpoint.  He continued to have issues with sciatica and underwent initial back surgery in June 2020 but required additional surgery in December 2020 by Dr. Saintclair Halsted involving L4-L5 bone spurs  He saw Dr. Virgina Jock for a telemedicine visit in December 2020.  Laboratory revealed a total cholesterol 153, HDL 72, LDL 69 and triglycerides 58.  LP(a)  was not checked at this time but in the past this has been markedly elevated and had been improved somewhat with combination niacin plus rosuvastatin.  He has seen Dr. Asencion Partridge Dohmeier and continues to be stable and denies any further night terrors.  He has been previously felt to have possible unrelated parasomnias.  He is not a exhibited any signs of the potential for Parkinson's disease.  He has continued to do well with low-dose clonazepam.  He is now 6 weeks since his last back surgery.  Over the past several weeks he started to resume walking and this will increase once given clearance by Dr. Saintclair Halsted.  He deniedrecurrent anginal symptoms, palpitations presyncope or syncope.  Since his last evaluation he has retired from [employer].  He continues to see Dr. Virgina Jock.  Laboratory has been checked in December 2021.  Chemistries were normal.  LDL cholesterol was excellent at 60 and apolipoprotein B normal at 58.  PSA was 2.65 and TSH was normal.  [name] has a history of increased LP(a) which is now felt to be independent risk factor.  Remotely this was significantly elevated at 148 when checked by me in December 2018 and had improved somewhat with statin therapy.  I had discussed with him in the past data regarding PCSK9 inhibition.  He denies chest pain PND orthopnea.  He remains active.  He presents for evaluation.  Past Medical History:  Diagnosis Date  . CAD (coronary artery disease)    a. Inf MI/PCI: 100% pRCA (4.0x22 Integrity BMS->dil to 9.83JA)-->SN complicated by VF and CGS;  b. 04/2012 MV: EF 65%, inf attenuation, no ischemia.  . Colon polyps    adenomatous  . Diverticulosis   . Hyperlipidemia   . Hypertensive heart disease   . RBD (REM behavioral disorder) 02/14/2014    Past Surgical History:  Procedure Laterality Date  . broken wrist    . COLONOSCOPY    . CORONARY STENT PLACEMENT     03/2011  . WISDOM TOOTH EXTRACTION      No Known Allergies  Current Outpatient Medications   Medication Sig Dispense Refill  . aspirin 81 MG tablet Take 81 mg by mouth daily.    . Cholecalciferol (VITAMIN D PO) Take by mouth.    . clonazePAM (KLONOPIN) 0.5 MG tablet TAKE 1 AND 1/2 TABLETS BY MOUTH AT BEDTIME AS NEEDED 45 tablet 5  . metoprolol tartrate (LOPRESSOR) 25 MG tablet TAKE 1/2 TABLET BY MOUTH TWICE A DAY 90 tablet 3  . Misc Natural Products (TART CHERRY ADVANCED) CAPS Take 1 capsule by mouth daily.    . niacin (NIASPAN) 500 MG CR tablet Take 1 tablet (500 mg total) by mouth at bedtime. 90 tablet 3  . rosuvastatin (CRESTOR) 40 MG tablet Take 1 tablet (40 mg total) by mouth daily.    . Turmeric 500 MG CAPS Take 1 capsule by mouth daily.     No current facility-administered medications for this visit.    Social History   Socioeconomic History  .  Marital status: Married    Spouse name: [name]  . Number of children: 1  . Years of education: Bachelor's  . Highest education level: Not on file  Occupational History  . Not on file  Tobacco Use  . Smoking status: Never Smoker  . Smokeless tobacco: Never Used  Substance and Sexual Activity  . Alcohol use: No    Alcohol/week: 0.0 standard drinks    Comment: rarely  . Drug use: No  . Sexual activity: Not on file  Other Topics Concern  . Not on file  Social History Narrative   Patient is married ([name]) and lives at home with his wife.   Patient has one adult child.   Patient is working full-time.   Patient has a Bachelor's degree.   Patient is right-handed.   Patient drinks one cup of tea  daily.   Social Determinants of Health   Financial Resource Strain: Not on file  Food Insecurity: Not on file  Transportation Needs: Not on file  Physical Activity: Not on file  Stress: Not on file  Social Connections: Not on file  Intimate Partner Violence: Not on file   Socially, he is married.  He works in Engineer, technical sales.  He is one child.  He exercises 5 days per week.  There is no tobacco use  Family History  Problem Relation  Age of Onset  . Cancer Mother        esophagus  . Heart disease Mother   . Hypertension Mother   . Esophageal cancer Mother   . Cancer Father        spinal   . Colon cancer Neg Hx   . Rectal cancer Neg Hx   . Stomach cancer Neg Hx    ROS General: Negative; No fevers, chills, or night sweats;  HEENT: Negative; No changes in vision or hearing, sinus congestion, difficulty swallowing Pulmonary: Negative; No cough, wheezing, shortness of breath, hemoptysis Cardiovascular: Negative; No chest pain, presyncope, syncope, palpitations GI: Negative; No nausea, vomiting, diarrhea, or abdominal pain GU: Negative; No dysuria, hematuria, or difficulty voiding Musculoskeletal: Status post back surgery secondary to sciatica June 2020 in December 2020 Hematologic/Oncology: Negative; no easy bruising, bleeding Endocrine: Negative; no heat/cold intolerance; no diabetes Neuro: Negative; no changes in balance, headaches Skin: Negative; No rashes or skin lesions Psychiatric: Negative; No behavioral problems, depression Sleep: Positive for REM sleep behavior disorder/parasomnia; No snoring, daytime sleepiness, hypersomnolence, bruxism, restless legs, hypnogognic hallucinations, no cataplexy Other comprehensive 14 point system review is negative.   PE BP 138/70 (BP Location: Left Arm, Patient Position: Sitting, Cuff Size: Normal)   Pulse (!) 49   Ht 6' (1.829 m)   Wt 200 lb (90.7 kg)   BMI 27.12 kg/m    Repeat blood pressure by me was 122/70  Wt Readings from Last 3 Encounters:  10/13/20 200 lb (90.7 kg)  02/19/20 194 lb (88 kg)  07/30/19 200 lb (90.7 kg)   General: Alert, oriented, no distress.  Skin: normal turgor, no rashes, warm and dry HEENT: Normocephalic, atraumatic. Pupils equal round and reactive to light; sclera anicteric; extraocular muscles intact;  Nose without nasal septal hypertrophy Mouth/Parynx benign; Mallinpatti scale 2 Neck: No JVD, no carotid bruits; normal carotid  upstroke Lungs: clear to ausculatation and percussion; no wheezing or rales Chest wall: without tenderness to palpitation Heart: PMI not displaced, RRR, s1 s2 normal, 1/6 systolic murmur, no diastolic murmur, no rubs, gallops, thrills, or heaves Abdomen: soft, nontender; no hepatosplenomehaly, BS+; abdominal aorta nontender  and not dilated by palpation. Back: no CVA tenderness Pulses 2+ Musculoskeletal: full range of motion, normal strength, no joint deformities Extremities: no clubbing cyanosis or edema, Homan's sign negative  Neurologic: grossly nonfocal; Cranial nerves grossly wnl Psychologic: Normal mood and affect   ECG (independently read by me): Sinus bradycardia at 49; no ectopy      January 2021 ECG (independently read by me): Normal sinus rhythm at 62 bpm.  Small nondiagnostic inferior Q waves with well-preserved R waves suggesting salvage of myocardium.  January 2020 ECG (independently read by me): Sinus Bradycardia at 47; Small inferiior Q waves with preserved R waves; normal intervals  June 07, 2018 ECG (independently read by me): Sinus bradycardia with PAC.  Normal intervals per no ST seg  November 2017 ECG (independently read by me): Sinus bradycardia 54 bpm.  ECG shows small inferior Q waves with preserved R waves.  May 2016 ECG (independently read by me): Sinus bradycardia 49 bpm.  No ectopy.  Normal intervals.  No ST segment changes.  April 2015 ECG today (Independently read by me): Sinus rhythm at 58 beats per minute.  No significant ST changes.  No evidence for prior myocardial infarction.  Prior 04/25/2013: ECG: Sinus bradycardia at 53 beats per minute. Normal. Very diminutive nondiagnostic Q waves in II, III, and F with preserved R waves concordant with his myocardial salvage   LABS: BMP Latest Ref Rng & Units 04/19/2011 04/17/2011 04/16/2011  Glucose 70 - 99 mg/dL 92 104(H) 140(H)  BUN 6 - 23 mg/dL 8 7 10   Creatinine 0.50 - 1.35 mg/dL 0.73 0.69 0.73  Sodium  135 - 145 mEq/L 137 135 136  Potassium 3.5 - 5.1 mEq/L 3.7 3.7 4.4  Chloride 96 - 112 mEq/L 104 105 107  CO2 19 - 32 mEq/L 25 24 21   Calcium 8.4 - 10.5 mg/dL 9.0 8.3(L) 7.9(L)   Hepatic Function Latest Ref Rng & Units 04/16/2011 04/16/2011  Total Protein 6.0 - 8.3 g/dL 5.7(L) 6.1  Albumin 3.5 - 5.2 g/dL 3.2(L) 3.6  AST 0 - 37 U/L 89(H) 14  ALT 0 - 53 U/L 18 11  Alk Phosphatase 39 - 117 U/L 60 64  Total Bilirubin 0.3 - 1.2 mg/dL 0.4 0.4   CBC Latest Ref Rng & Units 04/17/2011 04/16/2011 04/16/2011  WBC 4.0 - 10.5 K/uL 11.1(H) 13.2(H) -  Hemoglobin 13.0 - 17.0 g/dL 12.8(L) 13.1 13.9  Hematocrit 39.0 - 52.0 % 37.1(L) 36.7(L) 41.0  Platelets 150 - 400 K/uL 134(L) 162 -   No results found for: HGBA1C  Lab Results  Component Value Date   TSH 0.566 04/16/2011   Lipid Panel     Component Value Date/Time   CHOL 165 04/16/2011 0700   TRIG 65 04/16/2011 0700   HDL 49 04/16/2011 0700   CHOLHDL 3.4 04/16/2011 0700   VLDL 13 04/16/2011 0700   LDLCALC 103 (H) 04/16/2011 0700      RADIOLOGY: No results found.  IMPRESSION: 1. Coronary artery disease involving native coronary artery of native heart without angina pectoris   2. REM sleep behavior disorder   3. Hyperlipidemia with target LDL less than 70   4. Elevated lipoprotein(a)   5. Old MI (myocardial infarction): 03/2011    ASSESSMENT AND PLAN: [name] is a 70 year-old male who is almost 10 years following his acute coronary syndrome/inferior wall ST segment elevation myocardial infarction in 02/3253 which was complicated by recurrent episodes of ventricular fibrillation, cardiogenic shock which required multiple defibrillations. He has  been demonstrated to have essentially complete salvage of myocardium in the RCA territory.  He did not have concomitant CAD with his other coronaries being normal.  His last nuclear stress test was in 2017 which remained normal.  He has continued to do well from a cardiac standpoint and remains  asymptomatic.  His blood pressure today is excellent and on repeat by me was 122/70 which is consistent with his typical blood pressure recordings at home.  He continues to be on very low-dose metoprolol at 12.5 mg twice a day and baby aspirin.  ECG shows sinus bradycardia and he is asymptomatic.  He has been demonstrated to have very high LP(a) in the past which is an independent risk factor for CAD as well as potential association with aortic stenosis.  He has not been demonstrated to have any aortic stenosis.  He has been on niacin at low-dose in addition to rosuvastatin 20 mg.  And I previously have discussed potential 26% reduction in LP(a) with PCSK9 inhibition but his LDL cholesterols have been outstanding at 60.  I discussed with him new clinical trials underway utilizing messenger RNA technology for LP(a) reduction.  In the past he has been demonstrated to have night terrors consistent with a REM parasomnia and often can be associated with future development of degenerative neurologic disease such as Parkinson's or Lewy bodies.  Fortunately he is remained stable on clonazepam and is followed by Dr. Brett Fairy.  He will be seeing Dr. Virgina Jock later this year and I would suggest that an LP(a)  be obtained at that time.  As long as he remains stable I will see him in 1 year for reevaluation.   Troy Sine, MD, [facility]  10/15/2020 6:05 PM

## 2020-10-23 ENCOUNTER — Other Ambulatory Visit: Payer: Self-pay | Admitting: Cardiovascular Disease

## 2020-11-09 ENCOUNTER — Other Ambulatory Visit: Payer: Self-pay | Admitting: Cardiovascular Disease

## 2021-01-11 DIAGNOSIS — H2513 Age-related nuclear cataract, bilateral: Secondary | ICD-10-CM | POA: Diagnosis not present

## 2021-01-11 DIAGNOSIS — H52203 Unspecified astigmatism, bilateral: Secondary | ICD-10-CM | POA: Diagnosis not present

## 2021-01-11 DIAGNOSIS — H5213 Myopia, bilateral: Secondary | ICD-10-CM | POA: Diagnosis not present

## 2021-02-11 DIAGNOSIS — R972 Elevated prostate specific antigen [PSA]: Secondary | ICD-10-CM | POA: Diagnosis not present

## 2021-02-11 DIAGNOSIS — Z6826 Body mass index (BMI) 26.0-26.9, adult: Secondary | ICD-10-CM | POA: Diagnosis not present

## 2021-02-11 DIAGNOSIS — M5440 Lumbago with sciatica, unspecified side: Secondary | ICD-10-CM | POA: Diagnosis not present

## 2021-02-11 DIAGNOSIS — R030 Elevated blood-pressure reading, without diagnosis of hypertension: Secondary | ICD-10-CM | POA: Diagnosis not present

## 2021-02-18 ENCOUNTER — Encounter: Payer: Self-pay | Admitting: Neurology

## 2021-02-18 ENCOUNTER — Ambulatory Visit: Payer: Medicare PPO | Admitting: Neurology

## 2021-02-18 VITALS — BP 117/73 | HR 52 | Ht 72.0 in | Wt 200.5 lb

## 2021-02-18 DIAGNOSIS — G4752 REM sleep behavior disorder: Secondary | ICD-10-CM

## 2021-02-18 MED ORDER — CLONAZEPAM 0.5 MG PO TABS: ORAL_TABLET | ORAL | 5 refills | Status: DC

## 2021-02-18 NOTE — Patient Instructions (Signed)
Clonazepam Tablets What is this medication? CLONAZEPAM (kloe NA ze pam) treats seizures. It may also be used to treat panic disorder. It works by helping your nervous system calm down. It belongs to a group of medications called benzodiazepines. This medicine may be used for other purposes; ask your health care provider or pharmacist if you have questions. COMMON BRAND NAME(S): Ceberclon, Klonopin What should I tell my care team before I take this medication? They need to know if you have any of these conditions: An alcohol or drug abuse problem Bipolar disorder, depression, psychosis or other mental health condition Glaucoma Kidney or liver disease Lung or breathing disease Myasthenia gravis Parkinson disease Porphyria Seizures or a history of seizures Suicidal thoughts An unusual or allergic reaction to clonazepam, other benzodiazepines, foods, dyes, or preservatives Pregnant or trying to get pregnant Breast-feeding How should I use this medication? Take this medication by mouth with a glass of water. Follow the directions on the prescription label. If it upsets your stomach, take it with food or milk. Take your medication at regular intervals. Do not take it more often than directed. Do not stop taking or change the dose except on the advice of your care team. A special MedGuide will be given to you by the pharmacist with each prescription and refill. Be sure to read this information carefully each time. Talk to your care team regarding the use of this medication in children. Special care may be needed. Overdosage: If you think you have taken too much of this medicine contact a poison control center or emergency room at once. NOTE: This medicine is only for you. Do not share this medicine with others. What if I miss a dose? If you miss a dose, take it as soon as you can. If it is almost time for your next dose, take only that dose. Do not take double or extra doses. What may interact  with this medication? Do not take this medication with any of the following: Narcotic medications for cough Sodium oxybate This medication may also interact with the following: Alcohol Antihistamines for allergy, cough and cold Antiviral medications for HIV or AIDS Certain medications for anxiety or sleep Certain medications for depression, like amitriptyline, fluoxetine, sertraline Certain medications for fungal infections like ketoconazole and itraconazole Certain medications for seizures like carbamazepine, phenobarbital, phenytoin, primidone General anesthetics like halothane, isoflurane, methoxyflurane, propofol Local anesthetics like lidocaine, pramoxine, tetracaine Medications that relax muscles for surgery Narcotic medications for pain Phenothiazines like chlorpromazine, mesoridazine, prochlorperazine, thioridazine This list may not describe all possible interactions. Give your health care provider a list of all the medicines, herbs, non-prescription drugs, or dietary supplements you use. Also tell them if you smoke, drink alcohol, or use illegal drugs. Some items may interact with your medicine. What should I watch for while using this medication? Tell your care team if your symptoms do not start to get better or if they get worse. Do not stop taking except on your care team's advice. You may develop a severe reaction. Your care team will tell you how much medication to take. You may get drowsy or dizzy. Do not drive, use machinery, or do anything that needs mental alertness until you know how this medication affects you. To reduce the risk of dizzy and fainting spells, do not stand or sit up quickly, especially if you are an older patient. Alcohol may increase dizziness and drowsiness. Avoid alcoholic drinks. If you are taking another medication that also causes drowsiness, you may   have more side effects. Give your care team a list of all medications you use. Your care team will tell  you how much medication to take. Do not take more medication than directed. Call emergency services if you have problems breathing or unusual sleepiness. The use of this medication may increase the chance of suicidal thoughts or actions. Pay special attention to how you are responding while on this medication. Any worsening of mood, or thoughts of suicide or dying should be reported to your care team right away. What side effects may I notice from receiving this medication? Side effects that you should report to your care team as soon as possible: Allergic reactions-skin rash, itching, hives, swelling of the face, lips, tongue, or throat CNS depression-slow or shallow breathing, shortness of breath, feeling faint, dizziness, confusion, trouble staying awake Thoughts of suicide or self-harm, worsening mood, feelings of depression Side effects that usually do not require medical attention (report to your care team if they continue or are bothersome): Dizziness Drowsiness Headache This list may not describe all possible side effects. Call your doctor for medical advice about side effects. You may report side effects to FDA at 1-800-FDA-1088. Where should I keep my medication? Keep out of the reach of children and pets. This medication can be abused. Keep your medication in a safe place to protect it from theft. Do not share this medication with anyone. Selling or giving away this medication is dangerous and against the law. Store at room temperature between 15 and 30 degrees C (59 and 86 degrees F). Protect from light. Keep container tightly closed. This medication may cause accidental overdose and death if taken by other adults, children, or pets. Mix any unused medication with a substance like cat litter or coffee grounds. Then throw the medication away in a sealed container like a sealed bag or a coffee can with a lid. Do not use the medication after the expiration date. NOTE: This sheet is a  summary. It may not cover all possible information. If you have questions about this medicine, talk to your doctor, pharmacist, or health care provider.  2022 Elsevier/Gold Standard (2020-07-31 12:20:04)  

## 2021-02-18 NOTE — Progress Notes (Signed)
[facility] SLEEP MEDICINE CONSULT   Provider:  Melvyn Novas, M D  Referring Provider: Creola Corn, MD Primary Care Physician:  Creola Corn, MD  Chief Complaint  Patient presents with   Follow-up    Rm 10, alone. Here for yearly RBD REM f/u, last 2 weeks pt has had sciatic nerve pn in right leg and is seeing his neurosurgeon.     Interval history: 02-18-2021:  happily retired , Lawyer present for RV, followed since 2014 for REM BD and to screen for Lewy body or PD, and today reports sciatica in the right , arising from the buttock, hip and back to lower right leg. Dr.Cram  is following, MRI is scheduled. Pain shooting down, no numbness in the toes.    MOCA : 28/ 30 , all visio- spatial tasks perfect .  No cogwheeling and mild action tremor.    Interval history - 02-19-2020: He will be officially retired as of 07-17-2020. He has been at peace with his decision and is looking forward to new goals and interests. 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 .  No changes in sleep pattern and no tremor, or any PD related symptoms.    Interval 02-13-2019, [name] is a 70 y.o. caucasian, married, right handed male , [name] is a 70 year old right handed caucasian patient. Parasomnia remain controlled.  He had a flair up after surgery, when he had to take pain medication and muscle relaxants and he kicked "with a vengeance" while holding the Clonazepam. He has sciatica and still has some, L4-5 nerve was pinched.  He remains in pain, on the right. His MRI from yesterday showed swelling on the radix nervi.he will have a steroid injection in the next 2 weeks.  As he returned to clonazepam he immediately slept better and his REM BD/ night terrors have been controlled. He takes also tart cherry extract and tumeric.  BTW:he still works full time.    HPI: 02-07-2018  [name] is a 69 y.o. caucasian, married, right handed male , who is seen here as a  follow up after a sleep study. He is doing well, has not yet retired. 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 He sold his family home, the couple has an empty nest. Son lives in [place].  He has been sleeping OK. Night terrors are rare- and sleep is less disrupted. Klonopin 0.75 mg. Some nights he will have red wine, but will not take klonopin. He is still high cholesterol- Crestor increased. Routine visit.   01-2017 CD He advised me that he has not been diagnosed with Diverticulitis or hypertension as mentioned in his sleep study history .  Sleep sStudy was performed on 01-30-14. It documented a very mild AHI of 6.8 and an RDI of 6.8 supine sleep and REM stage and it accentuates the AHI REM AHI was 11.6 in supine AHI was 14 based on this my main suggestion to reduce snoring or apnea would be to avoid the supine sleep position. The patient had 22 minutes total throughout the night of low oxygen levels the oxygen nadir was 88% which is not significant. He did have some trouble to stay asleep there were multiple episodes of REM behavior disorder nor does 1 was captured on video and was clearly understandable that the patient's poor blood and another episode in more detail,  that document of these behaviors. During REM sleep limb movement and his muscle tone  was still activated confirming the diagnosis of REM behavior disorder.   As we have discussed in our previous visit there is a higher overlap of REM behavior disorder leading to Parkinson's disease. The patient had started taking Klonopin and has noticed a significant decrease in the frequency of salsa he is combined at night with melatonin, which seems to have complimented the Klonopin. [name] reports having vivid nightmares, usually being chased or attacked by an animal. He usually moves in bed- but at least once left the bed and tried to close and block the door. He has banged on the pillow , accidentally hitting his wife, who can display the  bruises here.  He reported having these dreams and acting out periods up to 4 times a night, and beginning much more infrequently the year before his heart attack in 2012.  He goes to bed around 11 PM and falls asleep promptly, his bedroom is quiet , dark and cool, he shares the bed with his wife.  By 1.30 or 2 AM he has often the  first display of REM BD . He reported not having vivid nightmares for about 6 month after his heart attack, which could be related to medication changes. He goes to the bathroom 2-3 times , and he has a high fluid intake, not caffeine, no ETOH, never been a smoker. The rise time in the morning is 5.30, he wakes spontaneously. He may sleep until 6 AM on weekends. He does not have a headache, he feels refreshed and restored. He snores, but his wife has not noted any apnea. Snoring is supine the loudest . He has a office with frosted glass window, and gets out of doors for lunch. 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   He has been hyperactive to some degree, always has  nervously moved his legs, tapped his feet.  No falls, no BP variability, no near syncope.   Interval history from 02-17-15. This is a yearly revisit, patient [name] last seen in July 2015, patent of Dr Timothy Lasso.  [name] is followed here for REM behavior disorder we are requesting him to fill sleep related questionnaires as well as memory or cognitive tests. He endorsed the geriatric depression score at 2 points which is clinically deemed insignificant the Epworth sleepiness score at 7 which is a normal range and the fatigue severity score at 20 points also in normal range. He did excellent with the Montral cognitive assessment test. I prefer to apply this test over her Mini-Mental Status Examination due to the patient's age and level of education. The patient was well able to pass the trail making test, copied a three-dimensional cube image and to the clock face. He was also able to name 3  animals do the serial 7 subtraction the language obstruction and fluency test and he recalled 3 out of 5 recall words as well as being fully oriented to date time and place. The word fluency was 14 words beginning with the letter F. His score was 28 points out of a possible 30 and she lost 2 points to delayed recall. This is not indicative of significant short-term memory loss.  She reports no tremors no quivering voice, no falls or gait instability. No cogwheeling is noted. He also does not suffer from restless legs. The patient reports that his REM behavior disorder has not affected him significantly over the last 12 months he did have a bout of several nights about 4 months  ago. However this has not recurred since and he states that it was several months before that about that he had any REM behavior that his wife had witnessed. He remains of course  unawareof the disorder. He sometimes mumbles. No hitting and no thrashing. He reports that he cannot recall his visit dreams that he used to have before.  Interval history from 01/05/2016. [name] is seen here today after a recent uptake in REM behavior activity. He reports that for a week or so he has been almost nightly engaged and acting out dreams but for the very last week prior to this visit the activity has ceased again. There is stress at work, he is 70 years old and feels unready to retire. He is ready to increase Klonopin.  Interval history 02-07-2017, I had the pleasure of seeing [name] today who again scored entirely normal on a Montral cognitive assessment, endorsed 25 points on the fatigue severity scale and 5 points on the Epworth Sleepiness Scale, and 3 out of 15 points in the geriatric  depression score. REM activity is controlled. Klonopin. He has a lot of stress, describes a burn out. 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    Review of Systems:  Out of a complete 14 system review, the patient complains of only  the following symptoms, and all other reviewed systems are negative. See above.  How likely are you to doze in the following situations: 0 = not likely, 1 = slight chance, 2 = moderate chance, 3 = high chance  Sitting and Reading?1 Watching Television? 1 Sitting inactive in a public place (theater or meeting)? Lying down in the afternoon when circumstances permit?3 Sitting and talking to someone? Sitting quietly after lunch without alcohol?1 In a car, while stopped for a few minutes in traffic? As a passenger in a car for an hour without a break?  Total = 6/ 24 , can now nap daily , 30 minutes  power nap daily- sleeps well at night.  FSS 12-63 points     Social History   Socioeconomic History   Marital status: Married    Spouse name: [name]   Number of children: 1   Years of education: Energy manager   Highest education level: Not on file  Occupational History   Not on file  Tobacco Use   Smoking status: Never   Smokeless tobacco: Never  Substance and Sexual Activity   Alcohol use: No    Alcohol/week: 0.0 standard drinks    Comment: rarely   Drug use: No   Sexual activity: Not on file  Other Topics Concern   Not on file  Social History Narrative   Patient is married ([name]) and lives at home with his wife.   Patient has one adult child.   Patient is working full-time.   Patient has a Bachelor's degree.   Patient is right-handed.   Patient drinks one cup of tea  daily.   Social Determinants of Health   Financial Resource Strain: Not on file  Food Insecurity: Not on file  Transportation Needs: Not on file  Physical Activity: Not on file  Stress: Not on file  Social Connections: Not on file  Intimate Partner Violence: Not on file    Family History  Problem Relation Age of Onset   Cancer Mother        esophagus   Heart disease Mother    Hypertension Mother    Esophageal cancer Mother  Cancer Father        spinal    Colon cancer Neg Hx    Rectal cancer Neg  Hx    Stomach cancer Neg Hx     Past Medical History:  Diagnosis Date   CAD (coronary artery disease)    a. Inf MI/PCI: 100% pRCA (4.0x22 Integrity BMS->dil to 4.20mm)-->MI complicated by VF and CGS;  b. 04/2012 MV: EF 65%, inf attenuation, no ischemia.   Colon polyps    adenomatous   Diverticulosis    Hyperlipidemia    Hypertensive heart disease    RBD (REM behavioral disorder) 02/14/2014    Past Surgical History:  Procedure Laterality Date   broken wrist     COLONOSCOPY     CORONARY STENT PLACEMENT     03/2011   WISDOM TOOTH EXTRACTION      Current Outpatient Medications  Medication Sig Dispense Refill   aspirin 81 MG tablet Take 81 mg by mouth daily.     Cholecalciferol (VITAMIN D PO) Take by mouth.     clonazePAM (KLONOPIN) 0.5 MG tablet TAKE 1 AND 1/2 TABLETS BY MOUTH AT BEDTIME AS NEEDED 45 tablet 5   metoprolol tartrate (LOPRESSOR) 25 MG tablet TAKE 1/2 TABLET BY MOUTH TWICE A DAY 90 tablet 3   Misc Natural Products (TART CHERRY ADVANCED) CAPS Take 1 capsule by mouth daily.     niacin (NIASPAN) 500 MG CR tablet TAKE 1 TABLET (500 MG TOTAL) BY MOUTH AT BEDTIME. 90 tablet 3   rosuvastatin (CRESTOR) 40 MG tablet Take 1 tablet (40 mg total) by mouth daily.     Turmeric 500 MG CAPS Take 1 capsule by mouth daily.     No current facility-administered medications for this visit.    Allergies as of 02/18/2021   (No Known Allergies)    Vitals: BP 117/73   Pulse (!) 52   Ht 6' (1.829 m)   Wt 200 lb 8 oz (90.9 kg)   BMI 27.19 kg/m  Last Weight:  Wt Readings from Last 1 Encounters:  02/18/21 200 lb 8 oz (90.9 kg)   Last Height:   Ht Readings from Last 1 Encounters:  02/18/21 6' (1.829 m)    Physical exam:  General: The patient is awake, alert and appears not in acute distress. The patient is well groomed. Head: Normocephalic, atraumatic. Neck is supple. Mallampati 4  neck circumference: 15.5  Inches . He has TMJ click on the left, no delayed swallowing, no  coughing.  Cardiovascular:  Regular rate and rhythm ,  without  murmurs or carotid bruit, and without distended neck veins. Respiratory: Lungs are clear to auscultation. Skin:  Without evidence of edema, or rash Trunk:  Patient has normal posture.  Neurologic exam : The patient is awake and alert, oriented to place and time. Memory : performed 2016 a  MOCA with patient , who lost 2 of 5 words in recall. No repeat in 2017. Montreal Cognitive Assessment  02/18/2021 02/07/2017  Visuospatial/ Executive (0/5) 5 5  Naming (0/3) 3 3  Attention: Read list of digits (0/2) 2 2  Attention: Read list of letters (0/1) 1 1  Attention: Serial 7 subtraction starting at 100 (0/3) 2 3  Language: Repeat phrase (0/2) 2 2  Language : Fluency (0/1) 1 1  Abstraction (0/2) 2 2  Delayed Recall (0/5) 4 2  Orientation (0/6) 6 5  Total 28 26     There is a normal attention span & concentration ability. He became tearful  when he described his work situation.  Speech is fluent without  dysarthria, dysphonia or aphasia. No hoarseness. Mood and affect are appropriate.  Cranial nerves: Pupils are equal and briskly reactive to light. Funduscopic exam deferred. Floaters in the right lens.  Extraocular movements  in vertical and horizontal planes intact and without nystagmus. Visual fields by finger perimetry are intact. Facial motor strength is symmetric and tongue and uvula move midline. Motor exam:  Normal tone- no cogwheeling , muscle bulk and symmetric normal strength in all extremities.  (He has no longer cogwheel rigidity over the left biceps, not the wrist not the shoulder, this could have been related to a C5 radiculopathy).  Sensory:  No deficits. Coordination: Finger-to-nose maneuvernormal without evidence of ataxia, but there was mild dysmetria,  there was a mild action tremor in the right. None at rest.  Gait and station: Patient walks without assistive device, steady and with normal armswing. Not stooped.   Strength within normal limits. Stance is stable and of normal base.  Tandem gait is intact, turns with 2 Steps, which are unfragmented.  Romberg testing- no fall tendency  Deep tendon reflexes: in the upper and lower extremities are symmetric and intact.   Assessment:  20 minutes  Routine interview and examination- After physical and neurologic examination, review of laboratory studies, imaging, neurophysiology testing and pre-existing records, assessment is :  REM BD with nocturnal  kicking and nightmares.-Confirmed REM BD in sleep study, now retired he feels his stress level was severe- and this may have contributed to his nightmares. This is no longer the case.  On betablocker- helps Tremor, anxiety.  No evidence of MCI. MOCA remains 28/30. No cogwheeling.    PS:  L 4-5 sciatica evaluated by Dr. Wynetta Emeryram. Helped by an injection in lower back, now recurrent - right leg.    Plan:  Klonopin 0.5 mg nightly, prn use twice at night.   Yearly follow up for PD early signs, currently no cog-wheelig, no EOM saccades and no rare blink, normal voice, no masked face.  Continue medication, yearly RV for REM BD with MOCA.     Melvyn Novasarmen Dwanda Tufano, MD

## 2021-03-02 DIAGNOSIS — M5127 Other intervertebral disc displacement, lumbosacral region: Secondary | ICD-10-CM | POA: Diagnosis not present

## 2021-03-02 DIAGNOSIS — M5440 Lumbago with sciatica, unspecified side: Secondary | ICD-10-CM | POA: Diagnosis not present

## 2021-03-02 DIAGNOSIS — M47816 Spondylosis without myelopathy or radiculopathy, lumbar region: Secondary | ICD-10-CM | POA: Diagnosis not present

## 2021-03-23 DIAGNOSIS — Z6827 Body mass index (BMI) 27.0-27.9, adult: Secondary | ICD-10-CM | POA: Diagnosis not present

## 2021-03-23 DIAGNOSIS — M5440 Lumbago with sciatica, unspecified side: Secondary | ICD-10-CM | POA: Diagnosis not present

## 2021-04-07 DIAGNOSIS — M5416 Radiculopathy, lumbar region: Secondary | ICD-10-CM | POA: Diagnosis not present

## 2021-06-28 DIAGNOSIS — Z125 Encounter for screening for malignant neoplasm of prostate: Secondary | ICD-10-CM | POA: Diagnosis not present

## 2021-06-28 DIAGNOSIS — I2510 Atherosclerotic heart disease of native coronary artery without angina pectoris: Secondary | ICD-10-CM | POA: Diagnosis not present

## 2021-06-28 DIAGNOSIS — E785 Hyperlipidemia, unspecified: Secondary | ICD-10-CM | POA: Diagnosis not present

## 2021-07-05 DIAGNOSIS — G4700 Insomnia, unspecified: Secondary | ICD-10-CM | POA: Diagnosis not present

## 2021-07-05 DIAGNOSIS — Z0000 Encounter for general adult medical examination without abnormal findings: Secondary | ICD-10-CM | POA: Diagnosis not present

## 2021-07-05 DIAGNOSIS — I252 Old myocardial infarction: Secondary | ICD-10-CM | POA: Diagnosis not present

## 2021-07-05 DIAGNOSIS — E785 Hyperlipidemia, unspecified: Secondary | ICD-10-CM | POA: Diagnosis not present

## 2021-07-05 DIAGNOSIS — R972 Elevated prostate specific antigen [PSA]: Secondary | ICD-10-CM | POA: Diagnosis not present

## 2021-07-05 DIAGNOSIS — Z1212 Encounter for screening for malignant neoplasm of rectum: Secondary | ICD-10-CM | POA: Diagnosis not present

## 2021-07-05 DIAGNOSIS — I2510 Atherosclerotic heart disease of native coronary artery without angina pectoris: Secondary | ICD-10-CM | POA: Diagnosis not present

## 2021-07-05 DIAGNOSIS — R82998 Other abnormal findings in urine: Secondary | ICD-10-CM | POA: Diagnosis not present

## 2021-07-05 DIAGNOSIS — M1990 Unspecified osteoarthritis, unspecified site: Secondary | ICD-10-CM | POA: Diagnosis not present

## 2021-07-05 DIAGNOSIS — D696 Thrombocytopenia, unspecified: Secondary | ICD-10-CM | POA: Diagnosis not present

## 2021-07-05 DIAGNOSIS — Z9861 Coronary angioplasty status: Secondary | ICD-10-CM | POA: Diagnosis not present

## 2021-08-04 DIAGNOSIS — N400 Enlarged prostate without lower urinary tract symptoms: Secondary | ICD-10-CM | POA: Diagnosis not present

## 2021-08-04 DIAGNOSIS — R972 Elevated prostate specific antigen [PSA]: Secondary | ICD-10-CM | POA: Diagnosis not present

## 2021-08-25 ENCOUNTER — Other Ambulatory Visit: Payer: Self-pay | Admitting: Cardiovascular Disease

## 2021-08-25 ENCOUNTER — Other Ambulatory Visit: Payer: Self-pay | Admitting: Neurology

## 2021-08-25 NOTE — Telephone Encounter (Signed)
Received refill request for clonazepam.  Last OV was on 02/18/21.  Next OV is scheduled for 02/21/22 .  Last RX was written on 07/25/21 for 45 tabs.   [place] [facility] has been reviewed. Please fill as work in Librarian, academic. Dr. Vickey Huger currently out of office.

## 2021-09-21 ENCOUNTER — Encounter: Payer: Self-pay | Admitting: Cardiovascular Disease

## 2021-09-26 ENCOUNTER — Other Ambulatory Visit: Payer: Self-pay | Admitting: Neurology

## 2021-09-28 ENCOUNTER — Other Ambulatory Visit: Payer: Self-pay | Admitting: Neurology

## 2021-09-28 ENCOUNTER — Encounter: Payer: Self-pay | Admitting: Neurology

## 2021-09-28 MED ORDER — CLONAZEPAM 0.5 MG PO TABS: ORAL_TABLET | ORAL | 2 refills | Status: DC

## 2021-09-29 ENCOUNTER — Other Ambulatory Visit: Payer: Self-pay | Admitting: Neurology

## 2021-09-30 ENCOUNTER — Other Ambulatory Visit: Payer: Self-pay | Admitting: Neurology

## 2021-09-30 MED ORDER — CLONAZEPAM 0.5 MG PO TABS: 0.7500 mg | ORAL_TABLET | Freq: Every day | ORAL | 5 refills | Status: DC

## 2021-09-30 MED ORDER — CLONAZEPAM 0.5 MG PO TABS: 0.7500 mg | ORAL_TABLET | Freq: Every day | ORAL | 2 refills | Status: DC

## 2021-09-30 NOTE — Progress Notes (Signed)
Refilled clonazepam 0.75 mg nightly. Larey Seat, MD   ?

## 2021-10-13 DIAGNOSIS — M5416 Radiculopathy, lumbar region: Secondary | ICD-10-CM | POA: Diagnosis not present

## 2021-12-20 ENCOUNTER — Ambulatory Visit: Payer: Medicare PPO | Admitting: Cardiovascular Disease

## 2021-12-20 ENCOUNTER — Encounter: Payer: Self-pay | Admitting: Cardiovascular Disease

## 2021-12-20 VITALS — BP 98/66 | HR 54 | Ht 72.0 in | Wt 206.4 lb

## 2021-12-20 DIAGNOSIS — G4752 REM sleep behavior disorder: Secondary | ICD-10-CM

## 2021-12-20 DIAGNOSIS — I2510 Atherosclerotic heart disease of native coronary artery without angina pectoris: Secondary | ICD-10-CM | POA: Diagnosis not present

## 2021-12-20 DIAGNOSIS — E785 Hyperlipidemia, unspecified: Secondary | ICD-10-CM

## 2021-12-20 DIAGNOSIS — E7841 Elevated Lipoprotein(a): Secondary | ICD-10-CM

## 2021-12-20 MED ORDER — EZETIMIBE 10 MG PO TABS: 10.0000 mg | ORAL_TABLET | Freq: Every day | ORAL | 3 refills | Status: DC

## 2021-12-20 NOTE — Progress Notes (Unsigned)
Patient ID: [name], male   DOB: 03/26/1951, 71 y.o.   MRN: [id number]        HPI: [name] is a 71 y.o. male who presents for a 57 month cardiology evaluation.  [name] developed an acute coronary syndrome/ST segment elevation inferior wall myocardial infarction on 04/16/2011. At that time, he had never had any cardiac symptoms. Immediately upon arrival to the catheterization laboratory he developed recurrent episodes of ventricular fibrillation and required multiple defibrillations. At catheterization he was found to have total proximal RCA occlusion in a very dominant vessel. He was in cardiogenic shock initially had significant for wall motion abnormality. I performed successful intervention and ultimately placed a 4.0x22 mm integrity stent postdilated to 4.38 mm in a large proximal right coronary artery. Ejection fraction ultimately improved to approximately 50-55%. A nuclear perfusion study in October 2013 showed essentially complete salvage of myocardium. He was on Brilinta for approximately 15 months and ultimately this was discontinued and he has been on aspirin alone. Also has been on combination therapy with low-dose Niaspan Crestor in addition to low-dose beta blocker therapy.  Since I  saw him he has remained cardiac stable.  He denies recurrent chest pain.  There are no palpitations.  He denies any PND or orthopnea.  He is unaware of any episodes of tachycardia.  He denies presyncope or syncope.  He does admit to increased work related stress.  However, he is managing to exercise 5 days per week.  He sees [name], for primary care .  Laboratory has been checked by [name].  He  was diagnosed as having insomnia with a REM sleep behavior disorder associated with  vivid frightful dreams, thrashing with viscious animals.  He has seen [name] and was started on Klonopin with some improvement in his symptomatology.  When he was last seen by her, Klonopin dose was increased.  He  now takes 1.5 mg at bedtime.  He underwent a four-year follow-up nuclear perfusion study on 11/24/2015, which remained normal.  There was mild inferolateral attenuation but no evidence for prior infarction or ischemia.  Ejection fraction was 50%.    I saw him in November 2018 at which time he was remaining stable from a cardiovascular standpoint., He has had difficulty with right sciatica resulting from L4, L5 degenerative disc disease which was confirmed on MRI.  He has undergone 2 spinal injections over the past several months.  He is still working.  He denied any episodes of chest pain, PND, orthopnea or change in exercise tolerance.    I saw him in January 2020 .at which time he continued to feel stable from a cardiac standpoint.  He was contemplating possible retirement at the end of the year.  .  He has continued to have some issues with sciatica for which he sees [name] and undergoes injections every 4 to 5 months.  He denies any recurrent anginal symptomatology.  In the past, he was found to have significant elevation of Lp(a) and when last checked by me in December 2018 LP(a) was significantly elevated at 148.  In July this apparently had been reduced to 130.  I last saw him, I recommended further titration of rosuvastatin.  He has continued to be on niacin.  He tells me [name] had just rechecked laboratory in December 2019  and he was told that his LPA was further reduced but he did not know the number and I have not received the laboratory.  He was last evaluated by me in January 2021 and since his prior evaluation he continued to be stable from a cardiac standpoint.  He continued to have issues with sciatica and underwent initial back surgery in June 2020 but required additional surgery in December 2020 by [name] involving L4-L5 bone spurs  He saw [name] for a telemedicine visit in December 2020.  Laboratory revealed a total cholesterol 153, HDL 72, LDL 69 and triglycerides 58.  LP(a)  was not checked at this time but in the past this has been markedly elevated and had been improved somewhat with combination niacin plus rosuvastatin.  He has seen [name] and continues to be stable and denies any further night terrors.  He has been previously felt to have possible unrelated parasomnias.  He is not a exhibited any signs of the potential for Parkinson's disease.  He has continued to do well with low-dose clonazepam.  He is now 6 weeks since his last back surgery.  Over the past several weeks he started to resume walking and this will increase once given clearance by [name].  He deniedrecurrent anginal symptoms, palpitations presyncope or syncope.  I last saw him on October 13, 2020.  Since his prior evaluation he  retired from [employer].  He continues to see [name].  Laboratory has been checked in December 2021.  Chemistries were normal.  LDL cholesterol was excellent at 60 and apolipoprotein B normal at 58.  PSA was 2.65 and TSH was normal.  [name] has a history of increased LP(a) which is now felt to be independent risk factor.  Remotely this was significantly elevated at 148 when checked by me in December 2018 and had improved somewhat with statin therapy.  I had discussed with him in the past data regarding PCSK9 inhibition resulting in potential 20% reduction in LPA.  Valuation I also discussed new clinical trials with RNA technology for LPA reduction.  He denied chest pain, PND orthopnea.  He remains active.  As I last saw him, he has continued to feel well.  Denies any recurrent chest pain or shortness of breath.  He denies any palpitations.  He remains active.  He admits to some mild weight gain.  He continues to be on clonazepam for night terrors followed by [name].  He is on gabapentin with his prior back surgery and sciatica.  He continues to be on low-dose metoprolol tartrate 12.5 mg twice a day.  He has been on niacin 500 mg and rosuvastatin 40 mg.  He had  recent laboratory in December 2022 by [name] which showed total cholesterol 143, HDL 60, LDL 73.  His LP(a) had reduced to 121 on prior evaluation.  He presents for evaluation.  Past Medical History:  Diagnosis Date   CAD (coronary artery disease)    a. Inf MI/PCI: 100% pRCA (4.0x22 Integrity BMS->dil to 9.62IW)-->LN complicated by VF and CGS;  b. 04/2012 MV: EF 65%, inf attenuation, no ischemia.   Colon polyps    adenomatous   Diverticulosis    Hyperlipidemia    Hypertensive heart disease    RBD (REM behavioral disorder) 02/14/2014    Past Surgical History:  Procedure Laterality Date   broken wrist     COLONOSCOPY     CORONARY STENT PLACEMENT     03/2011   WISDOM TOOTH EXTRACTION      No Known Allergies  Current Outpatient Medications  Medication Sig Dispense Refill   aspirin 81  MG tablet Take 81 mg by mouth daily.     Cholecalciferol (VITAMIN D PO) Take by mouth.     clonazePAM (KLONOPIN) 0.5 MG tablet Take 1.5 tablets (0.75 mg total) by mouth at bedtime. 45 tablet 5   ezetimibe (ZETIA) 10 MG tablet Take 1 tablet (10 mg total) by mouth daily. 90 tablet 3   gabapentin (NEURONTIN) 300 MG capsule Take 300 mg by mouth as needed.     metoprolol tartrate (LOPRESSOR) 25 MG tablet TAKE 1/2 TABLET BY MOUTH TWICE A DAY 90 tablet 3   rosuvastatin (CRESTOR) 40 MG tablet Take 1 tablet (40 mg total) by mouth daily.     Turmeric 500 MG CAPS Take 1 capsule by mouth daily.     Misc Natural Products (TART CHERRY ADVANCED) CAPS Take 1 capsule by mouth daily. (Patient not taking: Reported on 12/20/2021)     No current facility-administered medications for this visit.    Social History   Socioeconomic History   Marital status: Married    Spouse name: [name]   Number of children: 1   Years of education: Bachelor's   Highest education level: Not on file  Occupational History   Not on file  Tobacco Use   Smoking status: Never   Smokeless tobacco: Never  Substance and Sexual Activity    Alcohol use: No    Alcohol/week: 0.0 standard drinks    Comment: rarely   Drug use: No   Sexual activity: Not on file  Other Topics Concern   Not on file  Social History Narrative   Patient is married ([name]) and lives at home with his wife.   Patient has one adult child.   Patient is working full-time.   Patient has a Bachelor's degree.   Patient is right-handed.   Patient drinks one cup of tea  daily.   Social Determinants of Health   Financial Resource Strain: Not on file  Food Insecurity: Not on file  Transportation Needs: Not on file  Physical Activity: Not on file  Stress: Not on file  Social Connections: Not on file  Intimate Partner Violence: Not on file   Socially, he is married.  He works in Engineer, technical sales.  He is one child.  He exercises 5 days per week.  There is no tobacco use  Family History  Problem Relation Age of Onset   Cancer Mother        esophagus   Heart disease Mother    Hypertension Mother    Esophageal cancer Mother    Cancer Father        spinal    Colon cancer Neg Hx    Rectal cancer Neg Hx    Stomach cancer Neg Hx    ROS General: Negative; No fevers, chills, or night sweats;  HEENT: Negative; No changes in vision or hearing, sinus congestion, difficulty swallowing Pulmonary: Negative; No cough, wheezing, shortness of breath, hemoptysis Cardiovascular: Negative; No chest pain, presyncope, syncope, palpitations GI: Negative; No nausea, vomiting, diarrhea, or abdominal pain GU: Negative; No dysuria, hematuria, or difficulty voiding Musculoskeletal: Status post back surgery secondary to sciatica June 2020 in December 2020 Hematologic/Oncology: Negative; no easy bruising, bleeding Endocrine: Negative; no heat/cold intolerance; no diabetes Neuro: Negative; no changes in balance, headaches Skin: Negative; No rashes or skin lesions Psychiatric: Negative; No behavioral problems, depression Sleep: Positive for REM sleep behavior disorder/parasomnia; No  snoring, daytime sleepiness, hypersomnolence, bruxism, restless legs, hypnogognic hallucinations, no cataplexy Other comprehensive 14 point system review is negative.  PE BP 98/66 (BP Location: Left Arm)   Pulse (!) 54   Ht 6' (1.829 m)   Wt 206 lb 6.4 oz (93.6 kg)   SpO2 97%   BMI 27.99 kg/m    Repeat blood pressure by me was 112/68 supine and 98/66 standing  Wt Readings from Last 3 Encounters:  12/20/21 206 lb 6.4 oz (93.6 kg)  02/18/21 200 lb 8 oz (90.9 kg)  10/13/20 200 lb (90.7 kg)    General: Alert, oriented, no distress.  Skin: normal turgor, no rashes, warm and dry HEENT: Normocephalic, atraumatic. Pupils equal round and reactive to light; sclera anicteric; extraocular muscles intact; Nose without nasal septal hypertrophy Mouth/Parynx benign; Mallinpatti scale 2 Neck: No JVD, no carotid bruits; normal carotid upstroke Lungs: clear to ausculatation and percussion; no wheezing or rales Chest wall: without tenderness to palpitation Heart: PMI not displaced, RRR, s1 s2 normal, 1/6 systolic murmur, no diastolic murmur, no rubs, gallops, thrills, or heaves Abdomen: soft, nontender; no hepatosplenomehaly, BS+; abdominal aorta nontender and not dilated by palpation. Back: no CVA tenderness Pulses 2+ Musculoskeletal: full range of motion, normal strength, no joint deformities Extremities: no clubbing cyanosis or edema, Homan's sign negative  Neurologic: grossly nonfocal; Cranial nerves grossly wnl Psychologic: Normal mood and affect     December 20, 2021 ECG (independently read by me): Sinus bradycardia at 54  October 13, 2020 ECG (independently read by me): Sinus bradycardia at 49; no ectopy      January 2021 ECG (independently read by me): Normal sinus rhythm at 62 bpm.  Small nondiagnostic inferior Q waves with well-preserved R waves suggesting salvage of myocardium.  January 2020 ECG (independently read by me): Sinus Bradycardia at 47; Small inferiior Q waves with preserved  R waves; normal intervals  June 07, 2018 ECG (independently read by me): Sinus bradycardia with PAC.  Normal intervals per no ST seg  November 2017 ECG (independently read by me): Sinus bradycardia 54 bpm.  ECG shows small inferior Q waves with preserved R waves.  May 2016 ECG (independently read by me): Sinus bradycardia 49 bpm.  No ectopy.  Normal intervals.  No ST segment changes.  April 2015 ECG today (Independently read by me): Sinus rhythm at 58 beats per minute.  No significant ST changes.  No evidence for prior myocardial infarction.  Prior 04/25/2013: ECG: Sinus bradycardia at 53 beats per minute. Normal. Very diminutive nondiagnostic Q waves in II, III, and F with preserved R waves concordant with his myocardial salvage   LABS:    Latest Ref Rng & Units 04/19/2011    5:20 AM 04/17/2011    5:55 AM 04/16/2011    7:00 AM  BMP  Glucose 70 - 99 mg/dL 92   104   140    BUN 6 - 23 mg/dL 8   7   10     Creatinine 0.50 - 1.35 mg/dL 0.73   0.69   0.73    Sodium 135 - 145 mEq/L 137   135   136    Potassium 3.5 - 5.1 mEq/L 3.7   3.7   4.4    Chloride 96 - 112 mEq/L 104   105   107    CO2 19 - 32 mEq/L 25   24   21     Calcium 8.4 - 10.5 mg/dL 9.0   8.3   7.9        Latest Ref Rng & Units 04/16/2011    7:00 AM 04/16/2011   12:50  AM  Hepatic Function  Total Protein 6.0 - 8.3 g/dL 5.7   6.1    Albumin 3.5 - 5.2 g/dL 3.2   3.6    AST 0 - 37 U/L 89   14    ALT 0 - 53 U/L 18   11    Alk Phosphatase 39 - 117 U/L 60   64    Total Bilirubin 0.3 - 1.2 mg/dL 0.4   0.4        Latest Ref Rng & Units 04/17/2011    5:00 AM 04/16/2011    7:00 AM 04/16/2011    1:04 AM  CBC  WBC 4.0 - 10.5 K/uL 11.1   13.2     Hemoglobin 13.0 - 17.0 g/dL 12.8   13.1   13.9    Hematocrit 39.0 - 52.0 % 37.1   36.7   41.0    Platelets 150 - 400 K/uL 134   162      No results found for: HGBA1C  Lab Results  Component Value Date   TSH 0.566 04/16/2011   Lipid Panel     Component Value Date/Time   CHOL 165  04/16/2011 0700   TRIG 65 04/16/2011 0700   HDL 49 04/16/2011 0700   CHOLHDL 3.4 04/16/2011 0700   VLDL 13 04/16/2011 0700   LDLCALC 103 (H) 04/16/2011 0700      RADIOLOGY: No results found.  IMPRESSION: 1. Coronary artery disease involving native coronary artery of native heart without angina pectoris   2. Hyperlipidemia with target LDL less than 70   3. Elevated lipoprotein(a)   4. REM sleep behavior disorder     ASSESSMENT AND PLAN: [name] is a 71 year-old male who is almost 11 years following his acute coronary syndrome/inferior wall ST segment elevation myocardial infarction in 08/7780 which was complicated by recurrent episodes of ventricular fibrillation, cardiogenic shock which required multiple defibrillations. He has been demonstrated to have essentially complete salvage of myocardium in the RCA territory.  He did not have concomitant CAD with his other coronaries being normal.  His last nuclear stress test was in 2017 which remained normal.  He has continued to do well from a cardiac standpoint and remains asymptomatic.  He has continued to be on Toprol tartrate 12.5 mg and baby aspirin.  I reviewed most recent laboratory from [name] in December 2022.  Total cholesterol is 143, triglycerides 51, HDL 60 and LDL 73.  I have suggested he discontinue niacin and continue rosuvastatin 40 mg but I am adding Zetia 10 mg with target LDL less than 55.  I discussed with him potential clinical trials regarding LP(a) in patients who had had prior cardiac events utilizing messenger RNA technology.  His LP(a) when checked on prior evaluation had reduced to 121.  Clinically he is doing well and takes gabapentin for his sciatica.  Since initiating clonazepam his night terrors have significantly reduced.  He is aware of the association of potential future riski in development of Parkinson's disease or Lewy body disorder apparently has undergone prior MRI evaluation by [name] and has  remained stable.  I will see him in March 2024 for follow-up evaluation or sooner as needed.   Troy Sine, MD, [facility]  12/22/2021 6:28 PM

## 2021-12-20 NOTE — Patient Instructions (Signed)
Medication Instructions:  STOP niacin START Zetia 10 mg daily  *If you need a refill on your cardiac medications before your next appointment, please call your pharmacy*   Lab Work: Please return for FASTING labs in September (CMET, Lipid)  Our in office lab hours are Monday-Friday 8:00-4:00, closed for lunch 12:45-1:45 pm.  No appointment needed.  [facility] locations:   [facility] - [address] (Dr. Landry Dyke office) - [phone] ([facility]) - [address] [phone] [address]   [place] - [address]    [place]  - [address]    [place] - [address] - [address] [employer]  - [address] [place] - [address] ([facility])  Follow-Up: At [facility], you and your health needs are our priority.  As part of our continuing mission to provide you with exceptional heart care, we have created designated Provider Care Teams.  These Care Teams include your primary Cardiologist (physician) and Advanced Practice Providers (APPs -  Physician Assistants and Nurse Practitioners) who all work together to provide you with the care you need, when you need it.  We recommend signing up for the patient portal called "MyChart".  Sign up information is provided on this After Visit Summary.  MyChart is used to connect with patients for Virtual Visits (Telemedicine).  Patients are able to view lab/test results, encounter notes, upcoming appointments, etc.  Non-urgent messages can be sent to your provider as well.   To learn more about what you can do with MyChart, go to [email or web address].    Your next appointment:   9 month(s)  The format for your next appointment:   In Person  Provider:   Nicki Guadal[place]ara, MD {   Important Information About Sugar

## 2021-12-22 ENCOUNTER — Encounter: Payer: Self-pay | Admitting: Cardiovascular Disease

## 2022-01-03 DIAGNOSIS — M79672 Pain in left foot: Secondary | ICD-10-CM | POA: Diagnosis not present

## 2022-01-03 DIAGNOSIS — M5416 Radiculopathy, lumbar region: Secondary | ICD-10-CM | POA: Diagnosis not present

## 2022-01-03 DIAGNOSIS — M79671 Pain in right foot: Secondary | ICD-10-CM | POA: Diagnosis not present

## 2022-01-25 DIAGNOSIS — M5416 Radiculopathy, lumbar region: Secondary | ICD-10-CM | POA: Diagnosis not present

## 2022-01-25 DIAGNOSIS — Z6827 Body mass index (BMI) 27.0-27.9, adult: Secondary | ICD-10-CM | POA: Diagnosis not present

## 2022-01-27 DIAGNOSIS — M5126 Other intervertebral disc displacement, lumbar region: Secondary | ICD-10-CM | POA: Diagnosis not present

## 2022-01-27 DIAGNOSIS — M5416 Radiculopathy, lumbar region: Secondary | ICD-10-CM | POA: Diagnosis not present

## 2022-02-21 ENCOUNTER — Ambulatory Visit: Payer: Medicare PPO | Admitting: Neurology

## 2022-02-21 ENCOUNTER — Encounter: Payer: Self-pay | Admitting: Neurology

## 2022-02-21 VITALS — BP 135/81 | HR 53 | Ht 72.0 in | Wt 205.5 lb

## 2022-02-21 DIAGNOSIS — G4752 REM sleep behavior disorder: Secondary | ICD-10-CM

## 2022-02-21 DIAGNOSIS — I252 Old myocardial infarction: Secondary | ICD-10-CM | POA: Diagnosis not present

## 2022-02-21 MED ORDER — CLONAZEPAM 0.5 MG PO TABS: 0.7500 mg | ORAL_TABLET | Freq: Every day | ORAL | 5 refills | Status: DC

## 2022-02-21 NOTE — Progress Notes (Signed)
[facility]  Provider:  Larey Seat, M D  Referring Provider: Shon Baton, MD Primary Care Physician:  Shon Baton, MD  Chief Complaint  Patient presents with   Follow-up    Rm 10, alone. Here for yearly f/u for RBD and memory. Pt reports doing well. 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 Memory remains the same. MOCA:29    02-21-2022: RV Rm 10, alone. Here for yearly f/u for RBD, medication refills and memory trends .Pt has retired but does continues to work part -time. Memory remains the same. No changes in sleep pattern and no tremor. MOCA:29/ 30 points. He travels a lot with his wife.     Interval history: 02-18-2021:  happily retired , Publishing rights manager present for RV, followed since 2014 for REM BD and to screen for Lewy body or PD, and today reports sciatica in the right , arising from the buttock, hip and back to lower right leg. Dr.Cram  is following, MRI is scheduled. Pain shooting down, no numbness in the toes.  MOCA : 28/ 30 , all visio- spatial tasks perfect  No cogwheeling and mild action tremor.    Interval history - 02-19-2020: He will be officially retired as of 07-17-2020. He has been at peace with his decision and is looking forward to new goals and interests. 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 .  No changes in sleep pattern and no tremor, or any PD related symptoms.    Interval 02-13-2019, [name] is a 71 y.o. caucasian, married, right handed male , [name] is a 71 year old right handed caucasian patient. Parasomnia remain controlled.  He had a flair up after surgery, when he had to take pain medication and muscle relaxants and he kicked "with a vengeance" while holding the Clonazepam. He has sciatica and still has some, L4-5 nerve was pinched.  He remains in pain, on the right. His MRI from yesterday showed swelling on the radix nervi.he will have a steroid injection in the next 2 weeks.  As  he returned to clonazepam he immediately slept better and his REM BD/ night terrors have been controlled. He takes also tart cherry extract and tumeric.  BTW:he still works full time.    HPI: 02-07-2018  [name] is a 71 y.o. caucasian, married, right handed male , who is seen here as a follow up after a sleep study. He is doing well, has not yet retired. 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 He sold his family home, the couple has an empty nest. Son lives in [place].  He has been sleeping OK. Night terrors are rare- and sleep is less disrupted. Klonopin 0.75 mg. Some nights he will have red wine, but will not take klonopin. He is still high cholesterol- Crestor increased. Routine visit.   01-2017 CD He advised me that he has not been diagnosed with Diverticulitis or hypertension as mentioned in his sleep study history .  Sleep sStudy was performed on 01-30-14. It documented a very mild AHI of 6.8 and an RDI of 6.8 supine sleep and REM stage and it accentuates the AHI REM AHI was 11.6 in supine AHI was 14 based on this my main suggestion to reduce snoring or apnea would be to avoid the supine sleep position. The patient had 22 minutes total throughout the night of low oxygen levels the oxygen nadir was 88% which is not significant. He did  have some trouble to stay asleep there were multiple episodes of REM behavior disorder nor does 1 was captured on video and was clearly understandable that the patient's poor blood and another episode in more detail,  that document of these behaviors. During REM sleep limb movement and his muscle tone was still activated confirming the diagnosis of REM behavior disorder.   As we have discussed in our previous visit there is a higher overlap of REM behavior disorder leading to Parkinson's disease. The patient had started taking Klonopin and has noticed a significant decrease in the frequency of salsa he is combined at night with melatonin, which seems to have  complimented the Klonopin. [name] reports having vivid nightmares, usually being chased or attacked by an animal. He usually moves in bed- but at least once left the bed and tried to close and block the door. He has banged on the pillow , accidentally hitting his wife, who can display the bruises here.  He reported having these dreams and acting out periods up to 4 times a night, and beginning much more infrequently the year before his heart attack in 2012.  He goes to bed around 11 PM and falls asleep promptly, his bedroom is quiet , dark and cool, he shares the bed with his wife.  By 1.30 or 2 AM he has often the  first display of REM BD . He reported not having vivid nightmares for about 6 month after his heart attack, which could be related to medication changes. He goes to the bathroom 2-3 times , and he has a high fluid intake, not caffeine, no ETOH, never been a smoker. The rise time in the morning is 5.30, he wakes spontaneously. He may sleep until 6 AM on weekends. He does not have a headache, he feels refreshed and restored. He snores, but his wife has not noted any apnea. Snoring is supine the loudest . He has a office with frosted glass window, and gets out of doors for lunch. 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   He has been hyperactive to some degree, always has  nervously moved his legs, tapped his feet.  No falls, no BP variability, no near syncope.   Interval history from 02-17-15. This is a yearly revisit, patient [name] last seen in July 2015, patent of Dr Virgina Jock.  [name] is followed here for REM behavior disorder we are requesting him to fill sleep related questionnaires as well as memory or cognitive tests. He endorsed the geriatric depression score at 2 points which is clinically deemed insignificant the Epworth sleepiness score at 7 which is a normal range and the fatigue severity score at 20 points also in normal range. He did excellent with the  Montral cognitive assessment test. I prefer to apply this test over her Mini-Mental Status Examination due to the patient's age and level of education. The patient was well able to pass the trail making test, copied a three-dimensional cube image and to the clock face. He was also able to name 3 animals do the serial 7 subtraction the language obstruction and fluency test and he recalled 3 out of 5 recall words as well as being fully oriented to date time and place. The word fluency was 14 words beginning with the letter F. His score was 28 points out of a possible 30 and she lost 2 points to delayed recall. This is not indicative of significant short-term memory loss.  She reports  no tremors no quivering voice, no falls or gait instability. No cogwheeling is noted. He also does not suffer from restless legs. The patient reports that his REM behavior disorder has not affected him significantly over the last 12 months he did have a bout of several nights about 4 months ago. However this has not recurred since and he states that it was several months before that about that he had any REM behavior that his wife had witnessed. He remains of course  unawareof the disorder. He sometimes mumbles. No hitting and no thrashing. He reports that he cannot recall his visit dreams that he used to have before.  Interval history from 01/05/2016. [name] is seen here today after a recent uptake in REM behavior activity. He reports that for a week or so he has been almost nightly engaged and acting out dreams but for the very last week prior to this visit the activity has ceased again. There is stress at work, he is 71 years old and feels unready to retire. He is ready to increase Klonopin.  Interval history 02-07-2017, I had the pleasure of seeing [name] today who again scored entirely normal on a Montral cognitive assessment, endorsed 25 points on the fatigue severity scale and 5 points on the Epworth Sleepiness  Scale, and 3 out of 15 points in the geriatric  depression score. REM activity is controlled. Klonopin. He has a lot of stress, describes a burn out. 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    Review of Systems:  Out of a complete 14 system review, the patient complains of only the following symptoms, and all other reviewed systems are negative. See above.  How likely are you to doze in the following situations: 0 = not likely, 1 = slight chance, 2 = moderate chance, 3 = high chance  Sitting and Reading?1 Watching Television? 1 Sitting inactive in a public place (theater or meeting)? Lying down in the afternoon when circumstances permit?3 Sitting and talking to someone? Sitting quietly after lunch without alcohol?1 In a car, while stopped for a few minutes in traffic? As a passenger in a car for an hour without a break?  Total = 5/ 24 , can now nap daily , 30 minutes  power nap daily- sleeps well at night.  FSS only 12-63 points. GDS 2/ 15 points.     Social History   Socioeconomic History   Marital status: Married    Spouse name: [name]   Number of children: 1   Years of education: Bachelor's   Highest education level: Not on file  Occupational History   Not on file  Tobacco Use   Smoking status: Never   Smokeless tobacco: Never  Substance and Sexual Activity   Alcohol use: No    Alcohol/week: 0.0 standard drinks of alcohol    Comment: rarely   Drug use: No   Sexual activity: Not on file  Other Topics Concern   Not on file  Social History Narrative   Patient is married ([name]) and lives at home with his wife.   Patient has one adult child.   Patient is working full-time.   Patient has a Bachelor's degree.   Patient is right-handed.   Patient drinks one cup of tea  daily.   Social Determinants of Health   Financial Resource Strain: Not on file  Food Insecurity: Not on file  Transportation Needs: Not on file  Physical Activity: Not on file  Stress: Not on file  Social Connections: Not on file  Intimate Partner Violence: Not on file    Family History  Problem Relation Age of Onset   Cancer Mother        esophagus   Heart disease Mother    Hypertension Mother    Esophageal cancer Mother    Cancer Father        spinal    Colon cancer Neg Hx    Rectal cancer Neg Hx    Stomach cancer Neg Hx     Past Medical History:  Diagnosis Date   CAD (coronary artery disease)    a. Inf MI/PCI: 100% pRCA (4.0x22 Integrity BMS->dil to [id number] complicated by VF and CGS;  b. 04/2012 MV: EF 65%, inf attenuation, no ischemia.   Colon polyps    adenomatous   Diverticulosis    Hyperlipidemia    Hypertensive heart disease    RBD (REM behavioral disorder) 02/14/2014    Past Surgical History:  Procedure Laterality Date   broken wrist     COLONOSCOPY     CORONARY STENT PLACEMENT     03/2011   WISDOM TOOTH EXTRACTION      Current Outpatient Medications  Medication Sig Dispense Refill   aspirin 81 MG tablet Take 81 mg by mouth daily.     Cholecalciferol (VITAMIN D PO) Take by mouth.     clonazePAM (KLONOPIN) 0.5 MG tablet Take 1.5 tablets (0.75 mg total) by mouth at bedtime. 45 tablet 5   ezetimibe (ZETIA) 10 MG tablet Take 1 tablet (10 mg total) by mouth daily. 90 tablet 3   gabapentin (NEURONTIN) 300 MG capsule Take 300 mg by mouth as needed.     metoprolol tartrate (LOPRESSOR) 25 MG tablet TAKE 1/2 TABLET BY MOUTH TWICE A DAY 90 tablet 3   rosuvastatin (CRESTOR) 40 MG tablet Take 1 tablet (40 mg total) by mouth daily.     Turmeric 500 MG CAPS Take 1 capsule by mouth daily.     No current facility-administered medications for this visit.    Allergies as of 02/21/2022   (No Known Allergies)    Vitals: BP 135/81   Pulse (!) 53   Ht 6' (1.829 m)   Wt 205 lb 8 oz (93.2 kg)   BMI 27.87 kg/m  Last Weight:  Wt Readings from Last 1 Encounters:  02/21/22 205 lb 8 oz (93.2 kg)   Last Height:   Ht Readings from Last 1  Encounters:  02/21/22 6' (1.829 m)    Physical exam:  General: The patient is awake, alert and appears not in acute distress. The patient is well groomed. Head: Normocephalic, atraumatic. Neck is supple. Mallampati 4  neck circumference: 15.5  Inches . He has TMJ click on the left, no delayed swallowing, no coughing.  Cardiovascular:  Regular rate and rhythm ,  without  murmurs or carotid bruit, and without distended neck veins. Respiratory: Lungs are clear to auscultation. Skin:  Without evidence of edema, or rash Trunk:  Patient has normal posture.  Neurologic exam : The patient is awake and alert, oriented to place and time. Memory : we first performed a  [place] with patient in  2016 , who lost 2 of 5 words in recall. No repeat in 2017.    02/21/2022   10:54 AM 02/18/2021   10:47 AM 02/07/2017    9:52 AM  Montreal Cognitive Assessment   Visuospatial/ Executive (0/5) 5 5 5   Naming (0/3) 3 3 3  Attention: Read list of digits (0/2) 2 2 2   Attention: Read list of letters (0/1) 1 1 1   Attention: Serial 7 subtraction starting at 100 (0/3) 3 2 3   Language: Repeat phrase (0/2) 2 2 2   Language : Fluency (0/1) 1 1 1   Abstraction (0/2) 2 2 2   Delayed Recall (0/5) 5 4 2   Orientation (0/6) 5 6 5   Total 29 28 26      There is a normal attention span & concentration ability. He became tearful when he described his work situation.  Speech is fluent without  dysarthria, dysphonia or aphasia. No hoarseness. Mood and affect are appropriate.  Cranial nerves: Pupils are equal and briskly reactive to light. Funduscopic exam deferred. Floaters in the right lens.  Extraocular movements  in vertical and horizontal planes intact and without nystagmus. Visual fields by finger perimetry are intact. Facial motor strength is symmetric and tongue and uvula move midline. Motor exam:  Normal tone- no cogwheeling , normal muscle bulk and symmetric normal strength in all extremities.  (He has no longer cogwheel  rigidity over the left biceps, not the wrist not the shoulder, this could have been related to a C5 radiculopathy).  Sensory:  No deficits. Coordination: Finger-to-nose maneuvernormal without evidence of ataxia, but there was mild dysmetria,  there was a mild action tremor in the right. None at rest.  Gait and station: Patient walks without assistive device, steady and with normal armswing. Not stooped.  Strength within normal limits. Stance is stable and of normal base.  Tandem gait is intact, turns with 2 Steps, which are unfragmented.  Romberg testing- no fall tendency  Deep tendon reflexes: in the upper and lower extremities are symmetric and intact.   Assessment:  20 minutes  Routine interview and examination- After physical and neurologic examination, review of laboratory studies, imaging, neurophysiology testing and pre-existing records, assessment is :  REM BD with nocturnal  kicking and nightmares.-Confirmed REM BD in sleep study. Now retired he feels his stress level was severe in the past- and is much more moderate now.  ( this may have contributed to his nightmares).  This is no longer the case.  On betablocker- helps Tremor, anxiety.  No evidence of MCI. MOCA remains 29/30. No cogwheeling.      Plan:  Klonopin 0.5 mg nightly, prn use twice at night po. Refilled.    Yearly follow up for possible PD early signs, currently no cog-wheelig, no EOM saccades and no rare blink, normal voice, no masked face.  Continue medication, yearly RV for REM BD with MOCA.     , MD

## 2022-02-22 DIAGNOSIS — Z6827 Body mass index (BMI) 27.0-27.9, adult: Secondary | ICD-10-CM | POA: Diagnosis not present

## 2022-02-22 DIAGNOSIS — M5416 Radiculopathy, lumbar region: Secondary | ICD-10-CM | POA: Diagnosis not present

## 2022-02-25 DIAGNOSIS — M5417 Radiculopathy, lumbosacral region: Secondary | ICD-10-CM | POA: Diagnosis not present

## 2022-02-28 DIAGNOSIS — M5417 Radiculopathy, lumbosacral region: Secondary | ICD-10-CM | POA: Diagnosis not present

## 2022-03-02 DIAGNOSIS — M5417 Radiculopathy, lumbosacral region: Secondary | ICD-10-CM | POA: Diagnosis not present

## 2022-03-07 DIAGNOSIS — M5417 Radiculopathy, lumbosacral region: Secondary | ICD-10-CM | POA: Diagnosis not present

## 2022-03-09 DIAGNOSIS — M5417 Radiculopathy, lumbosacral region: Secondary | ICD-10-CM | POA: Diagnosis not present

## 2022-03-14 DIAGNOSIS — M5417 Radiculopathy, lumbosacral region: Secondary | ICD-10-CM | POA: Diagnosis not present

## 2022-03-16 DIAGNOSIS — M5417 Radiculopathy, lumbosacral region: Secondary | ICD-10-CM | POA: Diagnosis not present

## 2022-04-13 DIAGNOSIS — E785 Hyperlipidemia, unspecified: Secondary | ICD-10-CM | POA: Diagnosis not present

## 2022-04-13 DIAGNOSIS — I2510 Atherosclerotic heart disease of native coronary artery without angina pectoris: Secondary | ICD-10-CM | POA: Diagnosis not present

## 2022-04-14 LAB — LIPID PANEL
Chol/HDL Ratio: 2.2 ratio (ref 0.0–5.0)
Cholesterol, Total: 125 mg/dL (ref 100–199)
HDL: 56 mg/dL (ref >39)
LDL Chol Calc (NIH): 55 mg/dL (ref 0–99)
Triglycerides: 66 mg/dL (ref 0–149)
VLDL Cholesterol Cal: 14 mg/dL (ref 5–40)

## 2022-04-14 LAB — COMPREHENSIVE METABOLIC PANEL
ALT: 18 IU/L (ref 0–44)
AST: 22 IU/L (ref 0–40)
Albumin/Globulin Ratio: 2 (ref 1.2–2.2)
Albumin: 4.2 g/dL (ref 3.9–4.9)
Alkaline Phosphatase: 78 IU/L (ref 44–121)
BUN/Creatinine Ratio: 9 — ABNORMAL LOW (ref 10–24)
BUN: 8 mg/dL (ref 8–27)
Bilirubin Total: 0.7 mg/dL (ref 0.0–1.2)
CO2: 24 mmol/L (ref 20–29)
Calcium: 9.2 mg/dL (ref 8.6–10.2)
Chloride: 99 mmol/L (ref 96–106)
Creatinine, Ser: 0.9 mg/dL (ref 0.76–1.27)
Globulin, Total: 2.1 g/dL (ref 1.5–4.5)
Glucose: 86 mg/dL (ref 70–99)
Potassium: 4.3 mmol/L (ref 3.5–5.2)
Sodium: 135 mmol/L (ref 134–144)
Total Protein: 6.3 g/dL (ref 6.0–8.5)
eGFR: 92 mL/min/{1.73_m2} (ref >59)

## 2022-06-15 DIAGNOSIS — H2513 Age-related nuclear cataract, bilateral: Secondary | ICD-10-CM | POA: Diagnosis not present

## 2022-06-15 DIAGNOSIS — H5213 Myopia, bilateral: Secondary | ICD-10-CM | POA: Diagnosis not present

## 2022-07-20 DIAGNOSIS — R972 Elevated prostate specific antigen [PSA]: Secondary | ICD-10-CM | POA: Diagnosis not present

## 2022-07-20 DIAGNOSIS — Z1211 Encounter for screening for malignant neoplasm of colon: Secondary | ICD-10-CM | POA: Diagnosis not present

## 2022-07-20 DIAGNOSIS — I2510 Atherosclerotic heart disease of native coronary artery without angina pectoris: Secondary | ICD-10-CM | POA: Diagnosis not present

## 2022-07-20 DIAGNOSIS — E785 Hyperlipidemia, unspecified: Secondary | ICD-10-CM | POA: Diagnosis not present

## 2022-07-20 DIAGNOSIS — Z125 Encounter for screening for malignant neoplasm of prostate: Secondary | ICD-10-CM | POA: Diagnosis not present

## 2022-07-20 DIAGNOSIS — R7989 Other specified abnormal findings of blood chemistry: Secondary | ICD-10-CM | POA: Diagnosis not present

## 2022-07-25 DIAGNOSIS — E785 Hyperlipidemia, unspecified: Secondary | ICD-10-CM | POA: Diagnosis not present

## 2022-07-25 DIAGNOSIS — Z9861 Coronary angioplasty status: Secondary | ICD-10-CM | POA: Diagnosis not present

## 2022-07-25 DIAGNOSIS — G4752 REM sleep behavior disorder: Secondary | ICD-10-CM | POA: Diagnosis not present

## 2022-07-25 DIAGNOSIS — M5459 Other low back pain: Secondary | ICD-10-CM | POA: Diagnosis not present

## 2022-07-25 DIAGNOSIS — R972 Elevated prostate specific antigen [PSA]: Secondary | ICD-10-CM | POA: Diagnosis not present

## 2022-07-25 DIAGNOSIS — R82998 Other abnormal findings in urine: Secondary | ICD-10-CM | POA: Diagnosis not present

## 2022-07-25 DIAGNOSIS — I2510 Atherosclerotic heart disease of native coronary artery without angina pectoris: Secondary | ICD-10-CM | POA: Diagnosis not present

## 2022-07-25 DIAGNOSIS — Z1389 Encounter for screening for other disorder: Secondary | ICD-10-CM | POA: Diagnosis not present

## 2022-07-25 DIAGNOSIS — E871 Hypo-osmolality and hyponatremia: Secondary | ICD-10-CM | POA: Diagnosis not present

## 2022-07-25 DIAGNOSIS — Z0000 Encounter for general adult medical examination without abnormal findings: Secondary | ICD-10-CM | POA: Diagnosis not present

## 2022-07-25 DIAGNOSIS — F4322 Adjustment disorder with anxiety: Secondary | ICD-10-CM | POA: Diagnosis not present

## 2022-07-25 DIAGNOSIS — Z1331 Encounter for screening for depression: Secondary | ICD-10-CM | POA: Diagnosis not present

## 2022-09-11 ENCOUNTER — Other Ambulatory Visit: Payer: Self-pay | Admitting: Neurology

## 2022-09-28 ENCOUNTER — Other Ambulatory Visit: Payer: Self-pay | Admitting: Cardiovascular Disease

## 2022-10-24 ENCOUNTER — Encounter: Payer: Self-pay | Admitting: Cardiovascular Disease

## 2022-11-26 ENCOUNTER — Other Ambulatory Visit: Payer: Self-pay | Admitting: Cardiovascular Disease

## 2022-12-06 NOTE — Progress Notes (Signed)
Cardiology Clinic Note   Patient Name: [name] Date of Encounter: 12/08/2022  Primary Care Provider:  Creola Corn, MD Primary Cardiologist:  Nicki Guadalajara, MD  Patient Profile    [name] 72 year old male presents the clinic today for follow-up evaluation of his coronary artery disease and hypertension.  Past Medical History    Past Medical History:  Diagnosis Date   CAD (coronary artery disease)    a. Inf MI/PCI: 100% pRCA (4.0x22 Integrity BMS->dil to 4.59mm)-->MI complicated by VF and CGS;  b. 04/2012 MV: EF 65%, inf attenuation, no ischemia.   Colon polyps    adenomatous   Diverticulosis    Hyperlipidemia    Hypertensive heart disease    RBD (REM behavioral disorder) 02/14/2014   Past Surgical History:  Procedure Laterality Date   broken wrist     COLONOSCOPY     CORONARY STENT PLACEMENT     03/2011   WISDOM TOOTH EXTRACTION      Allergies  No Known Allergies  History of Present Illness    [name] has a PMH of coronary artery disease, hyperlipidemia, HTN, elevated LP(a), and REM sleep disorder.  He underwent cardiac catheterization in the setting of acute MI 9/12.  He developed ventricular fibrillation and required multiple defibrillations.  He was noted to have a total proximal RCA occlusion.  He was noted to be in cardiogenic shock.  He underwent PCI with DES.  His ejection fraction was noted to be 50-55%.  He underwent nuclear stress testing 10/13 which showed normal LVEF.  He was continued on Brilinta for 15 months and then was transition to aspirin monotherapy.  He underwent nuclear stress testing 11/24/2015 which was normal.  He was noted to have mild inferior lateral attenuation but no evidence of prior MI.  His EF was noted to be 50%.  He followed up regularly with Dr. Tresa Endo and was seen last 12/20/2021.  He continues to do well from a cardiac standpoint.  He denied chest pain shortness of breath.  He denied palpitations.  He remained physically  active.  He had gained some weight.  He was taking clonazepam for night tremors.  He was also taking gabapentin for prior back surgery and sciatica.  His LDL was noted to be 73 and his LP(a) had reduced to 121 when compared to prior evaluation.  66-month follow-up was planned.  He presents to the clinic today for follow-up evaluation and states he feels well but his son had a CVA in [place] at age 81.  We reviewed his LP(a), other cholesterol labs, and cardiac history.  He has been very active walking 3 miles per day and also enjoys taking his therapy dog to [place].  I will continue his current medication regimen, his EKG today shows sinus bradycardia 48 bpm.  His blood pressure is 104/58.  Will plan follow-up in 12 months.  Today he denies chest pain, shortness of breath, lower extremity edema, fatigue, palpitations, melena, hematuria, hemoptysis, diaphoresis, weakness, presyncope, syncope, orthopnea, and PND.   Home Medications    Prior to Admission medications   Medication Sig Start Date End Date Taking? Authorizing Provider  aspirin 81 MG tablet Take 81 mg by mouth daily.    [provider]  Cholecalciferol (VITAMIN D PO) Take by mouth.    [provider]  clonazePAM (KLONOPIN) 0.5 MG tablet TAKE 1.5 TABLETS (0.75 MG TOTAL) BY MOUTH AT BEDTIME. 09/12/22   Dohmeier, Porfirio Mylar, MD  ezetimibe (ZETIA) 10 MG tablet  TAKE 1 TABLET BY MOUTH EVERY DAY 11/28/22   Lennette Bihari, MD  gabapentin (NEURONTIN) 300 MG capsule Take 300 mg by mouth as needed. 11/22/21   [provider]  metoprolol tartrate (LOPRESSOR) 25 MG tablet TAKE 1/2 TABLET BY MOUTH TWICE A DAY 09/28/22   Lennette Bihari, MD  rosuvastatin (CRESTOR) 40 MG tablet Take 1 tablet (40 mg total) by mouth daily. 07/19/17   Lennette Bihari, MD  Turmeric 500 MG CAPS Take 1 capsule by mouth daily.    [provider]    Family History    Family History  Problem Relation Age of Onset   Cancer Mother        esophagus    Heart disease Mother    Hypertension Mother    Esophageal cancer Mother    Cancer Father        spinal    Colon cancer Neg Hx    Rectal cancer Neg Hx    Stomach cancer Neg Hx    He indicated that his mother is deceased. He indicated that his father is deceased. He indicated that his sister is alive. He indicated that the status of his neg hx is unknown.  Social History    Social History   Socioeconomic History   Marital status: Married    Spouse name: [name]   Number of children: 1   Years of education: Bachelor's   Highest education level: Not on file  Occupational History   Not on file  Tobacco Use   Smoking status: Never   Smokeless tobacco: Never  Substance and Sexual Activity   Alcohol use: No    Alcohol/week: 0.0 standard drinks of alcohol    Comment: rarely   Drug use: No   Sexual activity: Not on file  Other Topics Concern   Not on file  Social History Narrative   Patient is married ([name]) and lives at home with his wife.   Patient has one adult child.   Patient is working full-time.   Patient has a Bachelor's degree.   Patient is right-handed.   Patient drinks one cup of tea  daily.   Social Determinants of Health   Financial Resource Strain: Not on file  Food Insecurity: Not on file  Transportation Needs: Not on file  Physical Activity: Not on file  Stress: Not on file  Social Connections: Not on file  Intimate Partner Violence: Not on file     Review of Systems    General:  No chills, fever, night sweats or weight changes.  Cardiovascular:  No chest pain, dyspnea on exertion, edema, orthopnea, palpitations, paroxysmal nocturnal dyspnea. Dermatological: No rash, lesions/masses Respiratory: No cough, dyspnea Urologic: No hematuria, dysuria Abdominal:   No nausea, vomiting, diarrhea, bright red blood per rectum, melena, or hematemesis Neurologic:  No visual changes, wkns, changes in mental status. All other systems reviewed and are otherwise  negative except as noted above.  Physical Exam    VS:  BP (!) 104/58 (BP Location: Left Arm, Patient Position: Sitting, Cuff Size: Normal)   Pulse (!) 59   Ht 6' (1.829 m)   Wt 203 lb 3.2 oz (92.2 kg)   SpO2 96%   BMI 27.56 kg/m  , BMI Body mass index is 27.56 kg/m. GEN: Well nourished, well developed, in no acute distress. HEENT: normal. Neck: Supple, no JVD, carotid bruits, or masses. Cardiac: RRR, no murmurs, rubs, or gallops. No clubbing, cyanosis, edema.  Radials/DP/PT 2+ and equal bilaterally.  Respiratory:  Respirations regular and unlabored, clear to auscultation bilaterally. GI: Soft, nontender, nondistended, BS + x 4. MS: no deformity or atrophy. Skin: warm and dry, no rash. Neuro:  Strength and sensation are intact. Psych: Normal affect.  Accessory Clinical Findings    Recent Labs: 04/13/2022: ALT 18; BUN 8; Creatinine, Ser 0.90; Potassium 4.3; Sodium 135   Recent Lipid Panel    Component Value Date/Time   CHOL 125 04/13/2022 0906   TRIG 66 04/13/2022 0906   HDL 56 04/13/2022 0906   CHOLHDL 2.2 04/13/2022 0906   CHOLHDL 3.4 04/16/2011 0700   VLDL 13 04/16/2011 0700   LDLCALC 55 04/13/2022 0906         ECG personally reviewed by me today-sinus bradycardia 48 bpm- No acute changes   Nuclear stress test 11/24/2015  The left ventricular ejection fraction is mildly decreased (45-54%). Nuclear stress EF: 50%. There was no ST segment deviation noted during stress. The study is normal. This is a low risk study.   Poor quality images with significant attenuation of the inferolateral leads. Overall normal study with no prior infarct or ischemia.   Assessment & Plan   1.  Coronary artery disease-no chest pain today.  Underwent cardiac catheterization in the setting of acute MI 9/12.  He developed cardiogenic shock and underwent successful PCI.  His myocardium was preserved.  Subsequent stress testing has shown normal perfusion. Continue rosuvastatin,  ezetimibe, aspirin, metoprolol Heart healthy low-sodium diet-salty 6 given   Hyperlipidemia-LDL 58 on 07/20/22. Continue statin therapy, ezetimibe High-fiber diet Maintain physical activity  Bradycardia-EKG today shows sinus bradycardia 48 bpm.  Denies episodes of lightheadedness, presyncope or syncope. Continue to monitor  Essential hypertension-BP today 104/58. Maintain blood pressure log Continue metoprolol  Disposition: Follow-up with Dr. Tresa Endo or me in 9-12 months.   Thomasene Ripple. Conleigh Heinlein NP-C     12/08/2022, 4:47 PM [facility] [address] Office [phone] Fax [phone]    I spent 14 minutes examining this patient, reviewing medications, and using patient centered shared decision making involving her cardiac care.  Prior to her visit I spent greater than 20 minutes reviewing her past medical history,  medications, and prior cardiac tests.

## 2022-12-08 ENCOUNTER — Encounter: Payer: Self-pay | Admitting: General Practice

## 2022-12-08 ENCOUNTER — Ambulatory Visit: Payer: Medicare PPO | Attending: General Practice | Admitting: General Practice

## 2022-12-08 VITALS — BP 104/58 | HR 59 | Ht 72.0 in | Wt 203.2 lb

## 2022-12-08 DIAGNOSIS — I2510 Atherosclerotic heart disease of native coronary artery without angina pectoris: Secondary | ICD-10-CM

## 2022-12-08 DIAGNOSIS — R001 Bradycardia, unspecified: Secondary | ICD-10-CM

## 2022-12-08 DIAGNOSIS — E785 Hyperlipidemia, unspecified: Secondary | ICD-10-CM | POA: Diagnosis not present

## 2022-12-08 DIAGNOSIS — I10 Essential (primary) hypertension: Secondary | ICD-10-CM | POA: Diagnosis not present

## 2022-12-08 NOTE — Patient Instructions (Addendum)
Medication Instructions:  Your physician recommends that you continue on your current medications as directed. Please refer to the Current Medication list given to you today.   *If you need a refill on your cardiac medications before your next appointment, please call your pharmacy*   Lab Work: NONE ordered at this time of appointment   If you have labs (blood work) drawn today and your tests are completely normal, you will receive your results only by: MyChart Message (if you have MyChart) OR A paper copy in the mail If you have any lab test that is abnormal or we need to change your treatment, we will call you to review the results.   Testing/Procedures: NONE ordered at this time of appointment     Follow-Up: At [facility], you and your health needs are our priority.  As part of our continuing mission to provide you with exceptional heart care, we have created designated Provider Care Teams.  These Care Teams include your primary Cardiologist (physician) and Advanced Practice Providers (APPs -  Physician Assistants and Nurse Practitioners) who all work together to provide you with the care you need, when you need it.  We recommend signing up for the patient portal called "MyChart".  Sign up information is provided on this After Visit Summary.  MyChart is used to connect with patients for Virtual Visits (Telemedicine).  Patients are able to view lab/test results, encounter notes, upcoming appointments, etc.  Non-urgent messages can be sent to your provider as well.   To learn more about what you can do with MyChart, go to [email or web address].    Your next appointment:   1 year(s)  Provider:   Thomas Kelly, MD     Other Instructions  

## 2022-12-12 ENCOUNTER — Encounter: Payer: Self-pay | Admitting: Neurology

## 2022-12-13 ENCOUNTER — Other Ambulatory Visit: Payer: Self-pay | Admitting: Neurology

## 2022-12-13 ENCOUNTER — Other Ambulatory Visit: Payer: Self-pay

## 2023-01-16 ENCOUNTER — Other Ambulatory Visit: Payer: Self-pay | Admitting: Neurology

## 2023-01-16 NOTE — Telephone Encounter (Signed)
Requested Prescriptions   Pending Prescriptions Disp Refills   clonazePAM (KLONOPIN) 0.5 MG tablet [Pharmacy Med Name: CLONAZEPAM 0.5 MG TABLET] 45 tablet 0    Sig: TAKE 1 AND 1/2 TABLETS (0.75 MG TOTAL) BY MOUTH AT BEDTIME.   Last appt was  02/21/22, next appt scheduled for 02/23/23  Dispenses  Routing to work in to fill Dispensed Days Supply Quantity Provider Pharmacy  CLONAZEPAM 0.5 MG TABLET 12/13/2022 30 45 each Windell Norfolk, MD [facility] [phone] - O...  CLONAZEPAM 0.5 MG TABLET 11/12/2022 30 45 each Dohmeier, Porfirio Mylar, MD [facility] [phone] - O...  CLONAZEPAM 0.5 MG TABLET 10/10/2022 30 45 each Dohmeier, Porfirio Mylar, MD [facility] [phone] - O...  CLONAZEPAM 0.5 MG TABLET 09/12/2022 30 45 each Dohmeier, Porfirio Mylar, MD [facility] [phone] - O...  CLONAZEPAM 0.5 MG TABLET 08/08/2022 30 45 each Dohmeier, Porfirio Mylar, MD [facility] [phone] - O...  CLONAZEPAM 0.5 MG TABLET 07/10/2022 30 45 each Dohmeier, Porfirio Mylar, MD [facility] [phone] - O...  CLONAZEPAM 0.5 MG TABLET 06/06/2022 30 45 each Dohmeier, Porfirio Mylar, MD [facility] [phone] - O...  CLONAZEPAM 0.5 MG TABLET 05/08/2022 30 45 each Dohmeier, Porfirio Mylar, MD [facility] [phone] - O...  CLONAZEPAM 0.5 MG TABLET 04/11/2022 30 45 each Dohmeier, Porfirio Mylar, MD [facility] [phone] - O...  CLONAZEPAM 0.5 MG TABLET 03/08/2022 30 45 each Dohmeier, Porfirio Mylar, MD [facility] [phone] - O...  CLONAZEPAM 0.5 MG TABLET 02/06/2022 30 45 each Dohmeier, Porfirio Mylar, MD [facility] [phone] - O.Marland KitchenMarland Kitchen

## 2023-01-17 ENCOUNTER — Encounter: Payer: Self-pay | Admitting: Neurology

## 2023-01-18 ENCOUNTER — Encounter: Payer: Self-pay | Admitting: Anesthesiology

## 2023-01-18 NOTE — Telephone Encounter (Signed)
This encounter was created in error - please disregard.

## 2023-02-14 ENCOUNTER — Other Ambulatory Visit: Payer: Self-pay | Admitting: Neurology

## 2023-02-16 ENCOUNTER — Encounter: Payer: Self-pay | Admitting: Neurology

## 2023-02-16 NOTE — Telephone Encounter (Signed)
Dx is REM behavior disorder - patient has 5 refills.

## 2023-02-23 ENCOUNTER — Ambulatory Visit: Payer: Medicare PPO | Admitting: Neurology

## 2023-04-05 ENCOUNTER — Ambulatory Visit: Payer: Medicare PPO | Admitting: Neurology

## 2023-04-05 VITALS — BP 104/67 | HR 60 | Ht 72.0 in | Wt 201.0 lb

## 2023-04-05 DIAGNOSIS — G4752 REM sleep behavior disorder: Secondary | ICD-10-CM | POA: Diagnosis not present

## 2023-04-05 MED ORDER — CLONAZEPAM 0.5 MG PO TABS: 1.0000 mg | ORAL_TABLET | Freq: Every day | ORAL | 5 refills | Status: DC

## 2023-04-05 NOTE — Progress Notes (Signed)
Provider:  Melvyn Novas, MD  Primary Care Physician:  Creola Corn, MD [address] [place] [address]     Referring Provider: Creola Corn, Md [address] [place],  [place] [address]          Chief Complaint according to patient   Patient presents with:     REM BD            HISTORY OF PRESENT ILLNESS:  [name] is a 72 y.o. male patient who is here for revisit 04/05/2023 for REM BD. he has a history of CAD.  Chief concern according to patient :  none  He is working part time and considers himself retired. 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      02-21-2022: RV Rm 10, alone. Here for yearly f/u for RBD, medication refills and memory trends .Pt has retired but does continues to work part -time. Memory remains the same. No changes in sleep pattern and no tremor. MOCA:29/ 30 points. He travels a lot with his wife.        Interval history: 02-18-2021:  happily retired , Lawyer present for RV, followed since 2014 for REM BD and to screen for Lewy body or PD, and today reports sciatica in the right , arising from the buttock, hip and back to lower right leg. Dr.Cram  is following, MRI is scheduled. Pain shooting down, no numbness in the toes.  MOCA : 28/ 30 , all visio- spatial tasks perfect  No cogwheeling and mild action tremor.      Interval history - 02-19-2020: He will be officially retired as of 07-17-2020. He has been at peace with his decision and is looking forward to new goals and interests. 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 .  No changes in sleep pattern and no tremor, or any PD related symptoms.      Interval history : 02-13-2019, [name] is a 72 y.o. caucasian, married, right handed male , [name] is a 72 year old right handed caucasian patient. Parasomnia remain controlled.  He had a flair up after surgery, when he had to take pain medication and muscle relaxants and  he kicked "with a vengeance" while holding the Clonazepam. He has sciatica and still has some, L4-5 nerve was pinched.  He remains in pain, on the right. His MRI from yesterday showed swelling on the radix nervi.he will have a steroid injection in the next 2 weeks.  As he returned to clonazepam he immediately slept better and his REM BD/ night terrors have been controlled. He takes also tart cherry extract and tumeric.  BTW:he still works full time.       Review of Systems: Out of a complete 14 system review, the patient complains of only the following symptoms, and all other reviewed systems are negative.:     How likely are you to doze in the following situations: 0 = not likely, 1 = slight chance, 2 = moderate chance, 3 = high chance   Sitting and Reading? Watching Television? Sitting inactive in a public place (theater or meeting)? As a passenger in a car for an hour without a break? Lying down in the afternoon when circumstances permit? Sitting and talking to someone? Sitting quietly after lunch without alcohol? In a car, while stopped for a few minutes in traffic?   Total = 8/ 24 points  FSS endorsed at 28/ 63 points. GDS 0/ 15 points   No MOCA was done .   Social History   Socioeconomic History   Marital status: Married    Spouse name: [name]   Number of children: 1   Years of education: Bachelor's   Highest education level: Not on file  Occupational History   Not on file  Tobacco Use   Smoking status: Never   Smokeless tobacco: Never  Substance and Sexual Activity   Alcohol use: No    Alcohol/week: 0.0 standard drinks of alcohol    Comment: rarely   Drug use: No   Sexual activity: Not on file  Other Topics Concern   Not on file  Social History Narrative   Patient is married ([name]) and lives at home with his wife.   Patient has one adult child.   Patient is working full-time.   Patient has a Bachelor's degree.   Patient is right-handed.   Patient drinks  one cup of tea  daily.   Social Determinants of Health   Financial Resource Strain: Not on file  Food Insecurity: Not on file  Transportation Needs: Not on file  Physical Activity: Not on file  Stress: Not on file  Social Connections: Not on file    Family History  Problem Relation Age of Onset   Cancer Mother        esophagus   Heart disease Mother    Hypertension Mother    Esophageal cancer Mother    Cancer Father        spinal    Colon cancer Neg Hx    Rectal cancer Neg Hx    Stomach cancer Neg Hx     Past Medical History:  Diagnosis Date   CAD (coronary artery disease)    a. Inf MI/PCI: 100% pRCA (4.0x22 Integrity BMS->dil to 4.35mm)-->MI complicated by VF and CGS;  b. 04/2012 MV: EF 65%, inf attenuation, no ischemia.   Colon polyps    adenomatous   Diverticulosis    Hyperlipidemia    Hypertensive heart disease    RBD (REM behavioral disorder) 02/14/2014    Past Surgical History:  Procedure Laterality Date   broken wrist     COLONOSCOPY     CORONARY STENT PLACEMENT     03/2011   WISDOM TOOTH EXTRACTION       Current Outpatient Medications on File Prior to Visit  Medication Sig Dispense Refill   aspirin 81 MG tablet Take 81 mg by mouth daily.     Cholecalciferol (VITAMIN D PO) Take by mouth.     cholecalciferol (VITAMIN D3) 25 MCG (1000 UNIT) tablet Take 1,000 Units by mouth daily.     clonazePAM (KLONOPIN) 0.5 MG tablet TAKE 1 AND 1/2 TABLETS (0.75 MG TOTAL) BY MOUTH AT BEDTIME. 45 tablet 5   ezetimibe (ZETIA) 10 MG tablet TAKE 1 TABLET BY MOUTH EVERY DAY 90 tablet 3   gabapentin (NEURONTIN) 300 MG capsule Take 300 mg by mouth as needed.     metoprolol tartrate (LOPRESSOR) 25 MG tablet TAKE 1/2 TABLET BY MOUTH TWICE A DAY 90 tablet 3   rosuvastatin (CRESTOR) 40 MG tablet Take 1 tablet (40 mg total) by mouth daily.     Turmeric 500 MG CAPS Take 1 capsule by mouth daily.     No current facility-administered medications on file prior to visit.    No Known  Allergies   DIAGNOSTIC DATA (LABS, IMAGING, TESTING) - I reviewed patient records, labs, notes,  testing and imaging myself where available.  Lab Results  Component Value Date   WBC 11.1 (H) 04/17/2011   HGB 12.8 (L) 04/17/2011   HCT 37.1 (L) 04/17/2011   MCV 95.9 04/17/2011   PLT 134 (L) 04/17/2011      Component Value Date/Time   NA 135 04/13/2022 0906   K 4.3 04/13/2022 0906   CL 99 04/13/2022 0906   CO2 24 04/13/2022 0906   GLUCOSE 86 04/13/2022 0906   GLUCOSE 92 04/19/2011 0520   BUN 8 04/13/2022 0906   CREATININE 0.90 04/13/2022 0906   CALCIUM 9.2 04/13/2022 0906   PROT 6.3 04/13/2022 0906   ALBUMIN 4.2 04/13/2022 0906   AST 22 04/13/2022 0906   ALT 18 04/13/2022 0906   ALKPHOS 78 04/13/2022 0906   BILITOT 0.7 04/13/2022 0906   GFRNONAA >90 04/19/2011 0520   GFRAA >90 04/19/2011 0520   Lab Results  Component Value Date   CHOL 125 04/13/2022   HDL 56 04/13/2022   LDLCALC 55 04/13/2022   TRIG 66 04/13/2022   CHOLHDL 2.2 04/13/2022   No results found for: "HGBA1C" No results found for: "VITAMINB12" Lab Results  Component Value Date   TSH 0.566 04/16/2011    PHYSICAL EXAM:  Today's Vitals   04/05/23 0920  BP: 104/67  Pulse: 60  Weight: 201 lb (91.2 kg)  Height: 6' (1.829 m)   Body mass index is 27.26 kg/m.   Wt Readings from Last 3 Encounters:  04/05/23 201 lb (91.2 kg)  12/08/22 203 lb 3.2 oz (92.2 kg)  02/21/22 205 lb 8 oz (93.2 kg)     Ht Readings from Last 3 Encounters:  04/05/23 6' (1.829 m)  12/08/22 6' (1.829 m)  02/21/22 6' (1.829 m)        04/05/2023    9:21 AM 02/21/2022   10:54 AM 02/18/2021   10:47 AM 02/07/2017    9:52 AM  Montreal Cognitive Assessment   Visuospatial/ Executive (0/5) 5 5 5 5   Naming (0/3) 3 3 3 3   Attention: Read list of digits (0/2) 2 2 2 2   Attention: Read list of letters (0/1) 1 1 1 1   Attention: Serial 7 subtraction starting at 100 (0/3) 3 3 2 3   Language: Repeat phrase (0/2) 2 2 2 2   Language : Fluency  (0/1) 1 1 1 1   Abstraction (0/2) 2 2 2 2   Delayed Recall (0/5) 5 5 4 2   Orientation (0/6) 6 5 6 5   Total 30 29 28 26       General: The patient is awake, alert and appears not in acute distress. The patient is well groomed.  Trunk: The patient's posture is erect.   NEUROLOGIC EXAM: The patient is awake and alert, oriented to place and time.   Memory subjective described as intact.  Attention span & concentration ability appears normal.  Speech is fluent,  without  dysarthria, dysphonia or aphasia.  Mood and affect are appropriate.   Cranial nerves: no loss of smell or taste reported  Pupils are equal and briskly reactive to light.  Extraocular movements in vertical and horizontal planes were intact and without nystagmus. No Diplopia. Visual fields by finger perimetry are intact. Hearing was intact to soft voice and finger rubbing.    Facial sensation intact to fine touch.  Facial motor strength is symmetric and tongue and uvula move midline.  Neck ROM : rotation, tilt and flexion extension were normal for age and shoulder shrug was symmetrical.  Motor exam:  Symmetric bulk, tone and ROM.   Normal tone without cog wheeling, symmetric grip strength .   Coordination: Rapid alternating movements in the fingers/hands were of normal speed.  The Finger-to-nose maneuver was intact without evidence of ataxia, dysmetria or tremor.   Gait and station: Patient could rise unassisted from a seated position, walked without assistive device.   Deep tendon reflexes: in the  upper and lower extremities are symmetric and intact.   ASSESSMENT AND PLAN 35 -year -old male  here with:    1) REM BD still well controlled on Klonopin. Refilled medication.   2) no evidence of memory impairment and no parkinsonism.   3) no sleep significant interval history.    I plan to follow up either personally or through our NP within 12 months.   I would like to thank Creola Corn, MD and Creola Corn,  [place] [address] [place],  [place] [address] for allowing me to meet with and to take care of this pleasant patient.     After spending a total time of  25  minutes face to face and additional time for physical and neurologic examination, review of laboratory studies,  personal review of imaging studies, reports and results of other testing and review of referral information / records as far as provided in visit,   Electronically signed by: Melvyn Novas, MD 04/05/2023 9:55 AM  [facility] Board certified by The [employer] of Sleep Medicine and Diplomate of the [employer] of Sleep Medicine. Board certified In Neurology through the ABPN, Fellow of the [employer] of Neu[employer].

## 2023-04-10 DIAGNOSIS — H16203 Unspecified keratoconjunctivitis, bilateral: Secondary | ICD-10-CM | POA: Diagnosis not present

## 2023-06-09 DIAGNOSIS — M79671 Pain in right foot: Secondary | ICD-10-CM | POA: Diagnosis not present

## 2023-06-09 DIAGNOSIS — R051 Acute cough: Secondary | ICD-10-CM | POA: Diagnosis not present

## 2023-06-09 DIAGNOSIS — K219 Gastro-esophageal reflux disease without esophagitis: Secondary | ICD-10-CM | POA: Diagnosis not present

## 2023-06-09 DIAGNOSIS — J309 Allergic rhinitis, unspecified: Secondary | ICD-10-CM | POA: Diagnosis not present

## 2023-06-20 DIAGNOSIS — M5416 Radiculopathy, lumbar region: Secondary | ICD-10-CM | POA: Diagnosis not present

## 2023-06-23 DIAGNOSIS — H5213 Myopia, bilateral: Secondary | ICD-10-CM | POA: Diagnosis not present

## 2023-06-23 DIAGNOSIS — H2513 Age-related nuclear cataract, bilateral: Secondary | ICD-10-CM | POA: Diagnosis not present

## 2023-07-06 ENCOUNTER — Ambulatory Visit (INDEPENDENT_AMBULATORY_CARE_PROVIDER_SITE_OTHER): Payer: Medicare PPO

## 2023-07-06 ENCOUNTER — Ambulatory Visit: Payer: Medicare PPO | Admitting: Podiatry

## 2023-07-06 ENCOUNTER — Encounter: Payer: Self-pay | Admitting: Podiatry

## 2023-07-06 DIAGNOSIS — M2041 Other hammer toe(s) (acquired), right foot: Secondary | ICD-10-CM

## 2023-07-06 DIAGNOSIS — M2011 Hallux valgus (acquired), right foot: Secondary | ICD-10-CM | POA: Diagnosis not present

## 2023-07-06 DIAGNOSIS — D2371 Other benign neoplasm of skin of right lower limb, including hip: Secondary | ICD-10-CM

## 2023-07-06 DIAGNOSIS — M2012 Hallux valgus (acquired), left foot: Secondary | ICD-10-CM | POA: Diagnosis not present

## 2023-07-06 DIAGNOSIS — M7751 Other enthesopathy of right foot: Secondary | ICD-10-CM

## 2023-07-06 DIAGNOSIS — G5781 Other specified mononeuropathies of right lower limb: Secondary | ICD-10-CM

## 2023-07-06 DIAGNOSIS — M2042 Other hammer toe(s) (acquired), left foot: Secondary | ICD-10-CM | POA: Diagnosis not present

## 2023-07-06 DIAGNOSIS — M778 Other enthesopathies, not elsewhere classified: Secondary | ICD-10-CM

## 2023-07-06 DIAGNOSIS — M79673 Pain in unspecified foot: Secondary | ICD-10-CM | POA: Insufficient documentation

## 2023-07-06 MED ORDER — DEXAMETHASONE SODIUM PHOSPHATE 120 MG/30ML IJ SOLN
2.0000 mg | Freq: Once | INTRAMUSCULAR | Status: AC
  Administered 2023-07-06: 2 mg via INTRA_ARTICULAR

## 2023-07-06 MED ORDER — TRIAMCINOLONE ACETONIDE 40 MG/ML IJ SUSP
20.0000 mg | Freq: Once | INTRAMUSCULAR | Status: AC
  Administered 2023-07-06: 20 mg

## 2023-07-10 NOTE — Progress Notes (Signed)
Subjective:  Patient ID: [name], male    DOB: 1951/05/25,  MRN: [id number] HPI Chief Complaint  Patient presents with   Foot Pain    1st MPJ bilateral - bunion deformity, 2nd toe right rubbing on big toe, large irritated corn, plantar forefoot tenderness, tried wider, larger shoes   New Patient (Initial Visit)    72 y.o. male presents with the above complaint.   ROS: Denies fever chills nausea vomit muscle aches pains calf pain back pain chest pain shortness of breath.  Past Medical History:  Diagnosis Date   CAD (coronary artery disease)    a. Inf MI/PCI: 100% pRCA (4.0x22 Integrity BMS->dil to 4.2mm)-->MI complicated by VF and CGS;  b. 04/2012 MV: EF 65%, inf attenuation, no ischemia.   Colon polyps    adenomatous   Diverticulosis    Hyperlipidemia    Hypertensive heart disease    RBD (REM behavioral disorder) 02/14/2014   Past Surgical History:  Procedure Laterality Date   broken wrist     COLONOSCOPY     CORONARY STENT PLACEMENT     03/2011   WISDOM TOOTH EXTRACTION      Current Outpatient Medications:    pantoprazole (PROTONIX) 40 MG tablet, Take 40 mg by mouth every morning., Disp: , Rfl:    aspirin 81 MG tablet, Take 81 mg by mouth daily., Disp: , Rfl:    Cholecalciferol (VITAMIN D PO), Take by mouth., Disp: , Rfl:    cholecalciferol (VITAMIN D3) 25 MCG (1000 UNIT) tablet, Take 1,000 Units by mouth daily., Disp: , Rfl:    clonazePAM (KLONOPIN) 0.5 MG tablet, Take 2 tablets (1 mg total) by mouth at bedtime., Disp: 60 tablet, Rfl: 5   ezetimibe (ZETIA) 10 MG tablet, TAKE 1 TABLET BY MOUTH EVERY DAY, Disp: 90 tablet, Rfl: 3   gabapentin (NEURONTIN) 300 MG capsule, Take 300 mg by mouth as needed., Disp: , Rfl:    metoprolol tartrate (LOPRESSOR) 25 MG tablet, TAKE 1/2 TABLET BY MOUTH TWICE A DAY, Disp: 90 tablet, Rfl: 3   rosuvastatin (CRESTOR) 40 MG tablet, Take 1 tablet (40 mg total) by mouth daily., Disp: , Rfl:    Turmeric 500 MG CAPS, Take 1 capsule by mouth  daily., Disp: , Rfl:   No Known Allergies Review of Systems Objective:  There were no vitals filed for this visit.  General: Well developed, nourished, in no acute distress, alert and oriented x3   Dermatological: Skin is warm, dry and supple bilateral. Nails x 10 are well maintained; remaining integument appears unremarkable at this time. There are no open sores, no preulcerative lesions, no rash or signs of infection present.  Bursitis and a reactive hyperkeratotic lesion medial aspect second digit right foot at the level of the PIPJ secondary to bunion deformity.  Vascular: Dorsalis Pedis artery and Posterior Tibial artery pedal pulses are 2/4 bilateral with immedate capillary fill time. Pedal hair growth present. No varicosities and no lower extremity edema present bilateral.   Neruologic: Grossly intact via light touch bilateral. Vibratory intact via tuning fork bilateral. Protective threshold with Semmes Wienstein monofilament intact to all pedal sites bilateral. Patellar and Achilles deep tendon reflexes 2+ bilateral. No Babinski or clonus noted bilateral.   Musculoskeletal: No gross boney pedal deformities bilateral. No pain, crepitus, or limitation noted with foot and ankle range of motion bilateral. Muscular strength 5/5 in all groups tested bilateral.  Hallux abductovalgus deformity bilateral hammertoe deformity with overlying bursitis neuritis and capsulitis.    Gait: Unassisted,  Nonantalgic.    Radiographs:  Radiographs taken today demonstrate osseously mature individual hallux abductovalgus deformity hammertoe deformities greater than normal value.  Hallux abductus angle is greater than 12 degrees first intermetatarsal angle is greater than 14 degrees.  Assessment & Plan:   Assessment: Hallux abductovalgus deformity with bursitis benign reactive hyperkeratotic lesions neuritis.    Plan: Discussed etiology pathology conservative surgical therapies I injected the bursa today  debrided the reactive hyperkeratotic tissue discussed appropriate shoe gear I injected the area today with Kenalog as well tolerated procedure well discussed the need for surgical intervention we will follow-up with him in 1 month.  We will discuss surgery at that time.     Westley Blass T. Standard, [place]

## 2023-07-21 DIAGNOSIS — E785 Hyperlipidemia, unspecified: Secondary | ICD-10-CM | POA: Diagnosis not present

## 2023-07-21 DIAGNOSIS — R7989 Other specified abnormal findings of blood chemistry: Secondary | ICD-10-CM | POA: Diagnosis not present

## 2023-07-21 DIAGNOSIS — R972 Elevated prostate specific antigen [PSA]: Secondary | ICD-10-CM | POA: Diagnosis not present

## 2023-07-21 DIAGNOSIS — I2510 Atherosclerotic heart disease of native coronary artery without angina pectoris: Secondary | ICD-10-CM | POA: Diagnosis not present

## 2023-07-27 DIAGNOSIS — E785 Hyperlipidemia, unspecified: Secondary | ICD-10-CM | POA: Diagnosis not present

## 2023-07-28 DIAGNOSIS — R972 Elevated prostate specific antigen [PSA]: Secondary | ICD-10-CM | POA: Diagnosis not present

## 2023-07-28 DIAGNOSIS — D696 Thrombocytopenia, unspecified: Secondary | ICD-10-CM | POA: Diagnosis not present

## 2023-07-28 DIAGNOSIS — Z0000 Encounter for general adult medical examination without abnormal findings: Secondary | ICD-10-CM | POA: Diagnosis not present

## 2023-07-28 DIAGNOSIS — E785 Hyperlipidemia, unspecified: Secondary | ICD-10-CM | POA: Diagnosis not present

## 2023-07-28 DIAGNOSIS — I2510 Atherosclerotic heart disease of native coronary artery without angina pectoris: Secondary | ICD-10-CM | POA: Diagnosis not present

## 2023-07-28 DIAGNOSIS — R82998 Other abnormal findings in urine: Secondary | ICD-10-CM | POA: Diagnosis not present

## 2023-07-28 DIAGNOSIS — F4322 Adjustment disorder with anxiety: Secondary | ICD-10-CM | POA: Diagnosis not present

## 2023-07-28 DIAGNOSIS — G4752 REM sleep behavior disorder: Secondary | ICD-10-CM | POA: Diagnosis not present

## 2023-07-28 DIAGNOSIS — R0683 Snoring: Secondary | ICD-10-CM | POA: Diagnosis not present

## 2023-08-15 ENCOUNTER — Encounter: Payer: Self-pay | Admitting: Podiatry

## 2023-08-15 ENCOUNTER — Ambulatory Visit: Payer: Medicare PPO | Admitting: Podiatry

## 2023-08-15 DIAGNOSIS — M2011 Hallux valgus (acquired), right foot: Secondary | ICD-10-CM | POA: Diagnosis not present

## 2023-08-15 DIAGNOSIS — M2041 Other hammer toe(s) (acquired), right foot: Secondary | ICD-10-CM | POA: Diagnosis not present

## 2023-08-16 NOTE — Progress Notes (Signed)
He presents today for surgical consult of his right foot.  He states that timing is of the essence because his wife has had knee surgery and is recovering from that and his daughter is about to have a grandchild in July.  He states that he would like to go ahead and have this foot fixed he said ever since the injection and the debridement of the callosity to the medial aspect of the second toe he has felt much improved.  He does state however he would like to have this fixed so he does not have to worry about recurrence.  ROS: He denies fever chills nausea vomiting muscle aches pains calf pain back pain chest pain shortness of breath.  Allergies: No known drug allergies.  Objective: Vital signs are stable he is alert and oriented x 3.  Pulses are palpable.  Hallux abductovalgus deformity is resulting in a juxtaposition between the lateral aspect of the hallux and the medial aspect of the second toe which does not currently demonstrate hammertoe deformity.  However it does demonstrate skin breakdown to the medial aspect of the toe which has healed at this point.  There is some callosity still present.  His bunion deformity which is resulted in this juxtaposition is flexible in nature.  Radiographs do demonstrate an increase in the foot first met on intermetatarsal angle greater than normal value with a hallux abductus angle greater than normal value as well.  There is a spur at the base of the distal phalanx laterally of the hallux which is resulting in irritation and callosity development with bursitis along the medial aspect of hypertrophic condyles of the second head of the proximal phalanx right foot.  Assessment hallux abductovalgus deformity exostosis hallux lateral medial second toe at the PIPJ.  Plan: Discussed etiology pathology conservative surgical therapies at this point due to chronic pain and the risk of skin breakdown and infection he would like to have this corrected.  We consented him today  for an [facility] bunion repair to straighten the hallux and exostectomy of the distal phalanx lateral aspect of the hallux and the exostectomy of the medial PIPJ second digit right foot.  This should alleviate his symptoms and resolve this condition permanently.  We did discuss the possible postop complications which may include but are not limited to postop pain bleeding swelling infection recurrence need for further surgery overcorrection under correction loss of digit loss limb loss of life we also discussed anesthesia at the [facility] specialty surgical center consisting of a sciatic and saphenous block.  I will follow-up with him in the near future for surgical intervention.

## 2023-08-21 ENCOUNTER — Telehealth: Payer: Self-pay | Admitting: Podiatry

## 2023-08-21 NOTE — Telephone Encounter (Signed)
DOS-09/15/23  Serafina Royals [id number] EXOSTECTOMY 1ST AND 2ND [id number]  [employer] EFFECTIVE DATE-07/19/19  OOP-$4000.00 WITH REMAINING $4000.00 COINSURANCE-0%  PER THE [place] PORTAL,CPT CODE 45409 DOESN'T REQUIRE AUTH AND FOR CPT CODE 81191 PRIOR AUTH HAS BEEN APPROVED. GOOD FROM 09/15/23- 12/13/23.  Authorization #[id number]  Tracking #[id number]

## 2023-09-10 ENCOUNTER — Other Ambulatory Visit: Payer: Self-pay | Admitting: Neurology

## 2023-09-11 NOTE — Telephone Encounter (Signed)
 Last seen on 04/05/23 Follow up scheduled on 04/04/24 Last filled on 07/20/23 #60 tablets (30 day supply) Rx pending to be signed

## 2023-09-13 ENCOUNTER — Other Ambulatory Visit: Payer: Self-pay | Admitting: Podiatry

## 2023-09-13 DIAGNOSIS — M79671 Pain in right foot: Secondary | ICD-10-CM | POA: Diagnosis not present

## 2023-09-13 DIAGNOSIS — Z1152 Encounter for screening for COVID-19: Secondary | ICD-10-CM | POA: Diagnosis not present

## 2023-09-13 DIAGNOSIS — R051 Acute cough: Secondary | ICD-10-CM | POA: Diagnosis not present

## 2023-09-13 DIAGNOSIS — R509 Fever, unspecified: Secondary | ICD-10-CM | POA: Diagnosis not present

## 2023-09-13 DIAGNOSIS — J309 Allergic rhinitis, unspecified: Secondary | ICD-10-CM | POA: Diagnosis not present

## 2023-09-13 DIAGNOSIS — B349 Viral infection, unspecified: Secondary | ICD-10-CM | POA: Diagnosis not present

## 2023-09-13 MED ORDER — ONDANSETRON HCL 4 MG PO TABS: 4.0000 mg | ORAL_TABLET | Freq: Three times a day (TID) | ORAL | 0 refills | Status: DC | PRN

## 2023-09-13 MED ORDER — CEPHALEXIN 500 MG PO CAPS: 500.0000 mg | ORAL_CAPSULE | Freq: Three times a day (TID) | ORAL | 0 refills | Status: DC

## 2023-09-13 MED ORDER — OXYCODONE-ACETAMINOPHEN 10-325 MG PO TABS: 1.0000 | ORAL_TABLET | Freq: Three times a day (TID) | ORAL | 0 refills | Status: AC | PRN

## 2023-09-14 ENCOUNTER — Telehealth: Payer: Self-pay | Admitting: Podiatry

## 2023-09-14 NOTE — Telephone Encounter (Signed)
 Pt called to cancel and reschedule his 2/28 sx with Dr.Hyatt due to having a cold and a fever currently. Said he spoke with the surgery center and they stated that they wouldn't be able to do the surgery if he is still sick, so he would like to reschedule. The pt would like to reschedule at a time where he will be recovered before the end of may due to traveling plans.

## 2023-09-16 HISTORY — PX: OTHER SURGICAL HISTORY: SHX169

## 2023-09-20 ENCOUNTER — Other Ambulatory Visit: Payer: Self-pay | Admitting: Cardiovascular Disease

## 2023-09-21 ENCOUNTER — Encounter: Payer: Medicare PPO | Admitting: Podiatry

## 2023-10-04 ENCOUNTER — Other Ambulatory Visit: Payer: Self-pay | Admitting: Podiatry

## 2023-10-04 MED ORDER — OXYCODONE-ACETAMINOPHEN 10-325 MG PO TABS: 1.0000 | ORAL_TABLET | Freq: Three times a day (TID) | ORAL | 0 refills | Status: AC | PRN

## 2023-10-04 MED ORDER — ONDANSETRON HCL 4 MG PO TABS: 4.0000 mg | ORAL_TABLET | Freq: Three times a day (TID) | ORAL | 0 refills | Status: DC | PRN

## 2023-10-04 MED ORDER — CEPHALEXIN 500 MG PO CAPS
500.0000 mg | ORAL_CAPSULE | Freq: Three times a day (TID) | ORAL | 0 refills | Status: DC
Start: 2023-10-04 — End: 2023-10-26

## 2023-10-05 ENCOUNTER — Encounter: Payer: Medicare PPO | Admitting: Podiatry

## 2023-10-06 DIAGNOSIS — M85871 Other specified disorders of bone density and structure, right ankle and foot: Secondary | ICD-10-CM | POA: Diagnosis not present

## 2023-10-06 DIAGNOSIS — M25774 Osteophyte, right foot: Secondary | ICD-10-CM | POA: Diagnosis not present

## 2023-10-06 DIAGNOSIS — G8918 Other acute postprocedural pain: Secondary | ICD-10-CM | POA: Diagnosis not present

## 2023-10-06 DIAGNOSIS — M2011 Hallux valgus (acquired), right foot: Secondary | ICD-10-CM | POA: Diagnosis not present

## 2023-10-06 DIAGNOSIS — M21611 Bunion of right foot: Secondary | ICD-10-CM | POA: Diagnosis not present

## 2023-10-12 ENCOUNTER — Ambulatory Visit (INDEPENDENT_AMBULATORY_CARE_PROVIDER_SITE_OTHER)

## 2023-10-12 ENCOUNTER — Ambulatory Visit (INDEPENDENT_AMBULATORY_CARE_PROVIDER_SITE_OTHER): Payer: Medicare PPO | Admitting: Podiatry

## 2023-10-12 ENCOUNTER — Encounter: Payer: Self-pay | Admitting: Podiatry

## 2023-10-12 DIAGNOSIS — Z9889 Other specified postprocedural states: Secondary | ICD-10-CM

## 2023-10-12 DIAGNOSIS — M2011 Hallux valgus (acquired), right foot: Secondary | ICD-10-CM | POA: Diagnosis not present

## 2023-10-12 DIAGNOSIS — M898X7 Other specified disorders of bone, ankle and foot: Secondary | ICD-10-CM

## 2023-10-12 NOTE — Progress Notes (Signed)
 He presents today for his first postop visit he is status post [place] bunion Demay right exostectomy lateral aspect of the hallux medial aspect of the PIPJ second digit right foot.  He denies fever chills nausea vomit muscle aches pains calf pain back pain chest pain shortness of breath.  States that he is not taking the narcotics.  Objective: Vital signs are stable he is alert and oriented x 3.  Cam walker is intact ambulating today once removed demonstrates dry sterile dressing intact once removed demonstrates sutures are intact margins well coapted right foot.  There is no erythema edema cellulitis drainage or odor.  Mild ecchymosis to the toes.  Radiographs taken today demonstrate capital osteotomy the first metatarsal is in good alignment toes in good alignment exostectomy's aforementioned toes appears to be complete.  No signs of infection in the bone.  Assessment: Well-healing surgical toes.  Plan: Redressed today dressed a compressive dressing will follow-up with him in 1 to 2 weeks he will continue the use of his cam boot and dressing.  Keeping it clean and dry.  Keep it elevated is much as possible and ice.  Call me with questions or concerns

## 2023-10-26 ENCOUNTER — Ambulatory Visit (INDEPENDENT_AMBULATORY_CARE_PROVIDER_SITE_OTHER): Payer: Medicare PPO | Admitting: Podiatry

## 2023-10-26 ENCOUNTER — Encounter: Payer: Medicare PPO | Admitting: Podiatry

## 2023-10-26 ENCOUNTER — Encounter: Payer: Self-pay | Admitting: Podiatry

## 2023-10-26 DIAGNOSIS — M2011 Hallux valgus (acquired), right foot: Secondary | ICD-10-CM

## 2023-10-26 DIAGNOSIS — Z9889 Other specified postprocedural states: Secondary | ICD-10-CM

## 2023-10-26 DIAGNOSIS — M898X7 Other specified disorders of bone, ankle and foot: Secondary | ICD-10-CM

## 2023-10-26 NOTE — Progress Notes (Signed)
 He presents today date of surgery was 07/19/2023 status post [facility],The bunionectomy right with exostectomy to the same foot.  Status feeling really good he still needed to get out of his boot have not been taking any kind of pain medicines at all.  Objective: Vital signs are stable alert oriented x 3.  Pulses are palpable.  There is no erythema edema cellulitis drainage or odor his great range of motion of the fifth metatarsal phalangeal joint there is a little bit stiff.  Incision sites are gone on to heal uneventfully.  Assessment: Well-healing surgical foot right.  Plan: Remove all of the sutures today placed him in a compression anklet and recommended that he try to continue to stay off of the foot is much as possible but allowing him more freedom than he previously had.  Like to follow-up with him the first part of May for reevaluation and rex-ray.

## 2023-11-06 ENCOUNTER — Encounter: Payer: Self-pay | Admitting: Podiatry

## 2023-11-16 ENCOUNTER — Ambulatory Visit (INDEPENDENT_AMBULATORY_CARE_PROVIDER_SITE_OTHER): Payer: Medicare PPO | Admitting: Podiatry

## 2023-11-16 ENCOUNTER — Ambulatory Visit (INDEPENDENT_AMBULATORY_CARE_PROVIDER_SITE_OTHER)

## 2023-11-16 ENCOUNTER — Encounter: Payer: Self-pay | Admitting: Podiatry

## 2023-11-16 DIAGNOSIS — Z9889 Other specified postprocedural states: Secondary | ICD-10-CM

## 2023-11-16 DIAGNOSIS — M2011 Hallux valgus (acquired), right foot: Secondary | ICD-10-CM

## 2023-11-16 DIAGNOSIS — M898X7 Other specified disorders of bone, ankle and foot: Secondary | ICD-10-CM

## 2023-11-16 NOTE — Progress Notes (Signed)
 He presents today for postop visit date of surgery 10/06/2023.  He denies fever chills nausea muscle aches and pains states he went to the beach the other day did not do any running on the beach but states that his still states quite swollen but does not hurt that much.  Objective: Vital signs stable oriented x 3 there is no erythema there is moderate edema no cellulitis drainage or odor.  He has good range of motion of the first metatarsal phalangeal joint.  Radiographs taken today demonstrate osseously mature individual moderate edema to the forefoot.  Internal fixation is in good position bone healing is good.  No signs of infection.  Incision sites of bone to heal uneventfully.  Assessment: Well-healing surgical foot.  Plan: Follow-up with him in 1 month continue compression anklet during the daytime and getting his shoes as quick as possible and mobic.

## 2023-11-25 ENCOUNTER — Other Ambulatory Visit: Payer: Self-pay | Admitting: Cardiovascular Disease

## 2023-12-14 ENCOUNTER — Encounter: Payer: Self-pay | Admitting: Podiatry

## 2023-12-14 ENCOUNTER — Ambulatory Visit (INDEPENDENT_AMBULATORY_CARE_PROVIDER_SITE_OTHER): Admitting: Podiatry

## 2023-12-14 ENCOUNTER — Ambulatory Visit (INDEPENDENT_AMBULATORY_CARE_PROVIDER_SITE_OTHER)

## 2023-12-14 DIAGNOSIS — M2011 Hallux valgus (acquired), right foot: Secondary | ICD-10-CM | POA: Diagnosis not present

## 2023-12-14 DIAGNOSIS — Z9889 Other specified postprocedural states: Secondary | ICD-10-CM

## 2023-12-14 DIAGNOSIS — M898X7 Other specified disorders of bone, ankle and foot: Secondary | ICD-10-CM

## 2023-12-14 NOTE — Progress Notes (Signed)
 He presents today date of surgery 10/06/2023 [facility] bunion repair right foot exostectomy hallux and second toe right foot.  Denies fever chills nausea vomit muscle aches pain states that it still swells occasionally and hurts occasionally.  Objective: Muscle stable oriented x 3 he has mild edema first and metatarsal base good range of motion for 2 months out.  Radiographs taken today demonstrate osseously mature foot internal fixation in good position hallux is in good position.  Assessment: Well-healing surgical foot due back to regular activities follow-up with me in 1 month if necessary otherwise as needed.

## 2023-12-16 ENCOUNTER — Other Ambulatory Visit: Payer: Self-pay | Admitting: Cardiovascular Disease

## 2024-01-03 ENCOUNTER — Ambulatory Visit: Attending: Cardiology | Admitting: Cardiovascular Disease

## 2024-01-03 ENCOUNTER — Encounter: Payer: Self-pay | Admitting: Cardiovascular Disease

## 2024-01-03 DIAGNOSIS — E7841 Elevated Lipoprotein(a): Secondary | ICD-10-CM

## 2024-01-03 DIAGNOSIS — G4752 REM sleep behavior disorder: Secondary | ICD-10-CM

## 2024-01-03 DIAGNOSIS — R001 Bradycardia, unspecified: Secondary | ICD-10-CM | POA: Diagnosis not present

## 2024-01-03 DIAGNOSIS — I249 Acute ischemic heart disease, unspecified: Secondary | ICD-10-CM | POA: Diagnosis not present

## 2024-01-03 DIAGNOSIS — E785 Hyperlipidemia, unspecified: Secondary | ICD-10-CM

## 2024-01-03 DIAGNOSIS — I10 Essential (primary) hypertension: Secondary | ICD-10-CM | POA: Diagnosis not present

## 2024-01-03 NOTE — Patient Instructions (Signed)
 Medication Instructions:  NONE *If you need a refill on your cardiac medications before your next appointment, please call your pharmacy*  Lab Work: NONE If you have labs (blood work) drawn today and your tests are completely normal, you will receive your results only by: MyChart Message (if you have MyChart) OR A paper copy in the mail If you have any lab test that is abnormal or we need to change your treatment, we will call you to review the results.  Testing/Procedures: NONE  Follow-Up: At [facility], you and your health needs are our priority.  As part of our continuing mission to provide you with exceptional heart care, our providers are all part of one team.  This team includes your primary Cardiologist (physician) and Advanced Practice Providers or APPs (Physician Assistants and Nurse Practitioners) who all work together to provide you with the care you need, when you need it.  Your next appointment:   1 year(s)  Provider:   Dr. Alvis Ba  We recommend signing up for the patient portal called MyChart.  Sign up information is provided on this After Visit Summary.  MyChart is used to connect with patients for Virtual Visits (Telemedicine).  Patients are able to view lab/test results, encounter notes, upcoming appointments, etc.  Non-urgent messages can be sent to your provider as well.   To learn more about what you can do with MyChart, go to [email or web address].

## 2024-01-03 NOTE — Progress Notes (Signed)
 Patient ID: [name], male   DOB: May 19, 1951, 73 y.o.   MRN: [id number]       HPI: [name] is a 73 y.o. male who presents for a 14 month cardiology evaluation.  [name] developed an acute coronary syndrome/ST segment elevation inferior wall myocardial infarction on 04/16/2011. At that time, he had never had any cardiac symptoms. Immediately upon arrival to the catheterization laboratory he developed recurrent episodes of ventricular fibrillation and required multiple defibrillations. At catheterization he was found to have total proximal RCA occlusion in a very dominant vessel. He was in cardiogenic shock initially had significant for wall motion abnormality. I performed successful intervention and ultimately placed a 4.0x22 mm integrity stent postdilated to 4.38 mm in a large proximal right coronary artery. Ejection fraction ultimately improved to approximately 50-55%. A nuclear perfusion study in October 2013 showed essentially complete salvage of myocardium. He was on Brilinta for approximately 15 months and ultimately this was discontinued and he has been on aspirin alone. Also has been on combination therapy with low-dose Niaspan  Crestor  in addition to low-dose beta blocker therapy.  Since I  saw him he has remained cardiac stable.  He denies recurrent chest pain.  There are no palpitations.  He denies any PND or orthopnea.  He is unaware of any episodes of tachycardia.  He denies presyncope or syncope.  He does admit to increased work related stress.  However, he is managing to exercise 5 days per week.  He sees Dr. Mamie Searles, for primary care .  Laboratory has been checked by Dr. Mamie Searles.  He  was diagnosed as having insomnia with a REM sleep behavior disorder associated with  vivid frightful dreams, thrashing with viscious animals.  He has seen Dr. Felisa Hose and was started on Klonopin  with some improvement in his symptomatology.  When he was last seen by her, Klonopin  dose was increased.  He now  takes 1.5 mg at bedtime.  He underwent a four-year follow-up nuclear perfusion study on 11/24/2015, which remained normal.  There was mild inferolateral attenuation but no evidence for prior infarction or ischemia.  Ejection fraction was 50%.    I saw him in November 2018 at which time he was remaining stable from a cardiovascular standpoint., He has had difficulty with right sciatica resulting from L4, L5 degenerative disc disease which was confirmed on MRI.  He has undergone 2 spinal injections over the past several months.  He is still working.  He denied any episodes of chest pain, PND, orthopnea or change in exercise tolerance.    I saw him in January 2020 .at which time he continued to feel stable from a cardiac standpoint.  He was contemplating possible retirement at the end of the year.  .  He has continued to have some issues with sciatica for which he sees Dr. Lamon Pillow and undergoes injections every 4 to 5 months.  He denies any recurrent anginal symptomatology.  In the past, he was found to have significant elevation of Lp(a) and when last checked by me in December 2018 LP(a) was significantly elevated at 148.  In July this apparently had been reduced to 130.  I last saw him, I recommended further titration of rosuvastatin .  He has continued to be on niacin .  He tells me Dr. Mamie Searles had just rechecked laboratory in December 2019  and he was told that his LPA was further reduced but he did not know the number and I have not received the laboratory.  He was evaluated by me in January 2021 and since his prior evaluation he continued to be stable from a cardiac standpoint.  He continued to have issues with sciatica and underwent initial back surgery in June 2020 but required additional surgery in December 2020 by Dr. Lamon Pillow involving L4-L5 bone spurs  He saw Dr. Mamie Searles for a telemedicine visit in December 2020.  Laboratory revealed a total cholesterol 153, HDL 72, LDL 69 and triglycerides 58.  LP(a) was not  checked at this time but in the past this has been markedly elevated and had been improved somewhat with combination niacin  plus rosuvastatin .  He has seen Dr. Raoul Byes Dohmeier and continues to be stable and denies any further night terrors.  He has been previously felt to have possible unrelated parasomnias.  He is not a exhibited any signs of the potential for Parkinson's disease.  He has continued to do well with low-dose clonazepam .  He is now 6 weeks since his last back surgery.  Over the past several weeks he started to resume walking and this will increase once given clearance by Dr. Lamon Pillow.  He deniedrecurrent anginal symptoms, palpitations presyncope or syncope.  I saw him on October 13, 2020.  Since his prior evaluation he  retired from [employer].  He continues to see Dr. Mamie Searles.  Laboratory has been checked in December 2021.  Chemistries were normal.  LDL cholesterol was excellent at 60 and apolipoprotein B normal at 58.  PSA was 2.65 and TSH was normal.  [name] has a history of increased LP(a) which is now felt to be independent risk factor.  Remotely this was significantly elevated at 148 when checked by me in December 2018 and had improved somewhat with statin therapy.  I had discussed with him in the past data regarding PCSK9 inhibition resulting in potential 20% reduction in LPA.  Valuation I also discussed new clinical trials with RNA technology for LPA reduction.  He denied chest pain, PND orthopnea.  He remains active.  I last saw him on December 20, 2021.  He continued to feel well and denied any recurrent chest pain or shortness of breath.  He denies any palpitations.  He remains active.  He admits to some mild weight gain.  He continues to be on clonazepam  for night terrors followed by Dr. Albertina Hugger.  He is on gabapentin  with his prior back surgery and sciatica.  He continues to be on low-dose metoprolol  tartrate 12.5 mg twice a day.  He has been on niacin  500 mg and rosuvastatin  40 mg.  He had  recent laboratory in December 2022 by Dr. Mamie Searles which showed total cholesterol 143, HDL 60, LDL 73.  His LP(a) had reduced to 121 on prior evaluation.  He presents for evaluation.  Since I last saw him, he was evaluated by Lawana Pray, NP on Dec 08, 2022.  He remained stable.  His LP(a) had reduced to 121 compared to prior assessment.  He remains stable but unfortunately, his son had a CVA in [place]  at age 47.  Presently [name] feels well.  He walks on a daily basis.  He and his wife also take his therapy to the hospital 2 days/week.  He has had issues with sciatica and apparently had remote back surgery and more recently right toe surgery.  He continues to be on aspirin 81 mg daily.  He is on Zetia  10 mg and rosuvastatin  40 mg for lipid management.  He takes metoprolol  tartrate 12.5 mg  twice a day.  He continues to be on Klonopin  for his night terrors and has followed with Dr. Albertina Hugger and remains stable.  He presents for evaluation.   Past Medical History:  Diagnosis Date   CAD (coronary artery disease)    a. Inf MI/PCI: 100% pRCA (4.0x22 Integrity BMS->dil to 4.31mm)-->MI complicated by VF and CGS;  b. 04/2012 MV: EF 65%, inf attenuation, no ischemia.   Colon polyps    adenomatous   Diverticulosis    Hyperlipidemia    Hypertensive heart disease    RBD (REM behavioral disorder) 02/14/2014    Past Surgical History:  Procedure Laterality Date   broken wrist     COLONOSCOPY     CORONARY STENT PLACEMENT     03/2011   WISDOM TOOTH EXTRACTION      No Known Allergies  Current Outpatient Medications  Medication Sig Dispense Refill   aspirin 81 MG tablet Take 81 mg by mouth daily.     cholecalciferol (VITAMIN D3) 25 MCG (1000 UNIT) tablet Take 1,000 Units by mouth daily.     clonazePAM  (KLONOPIN ) 0.5 MG tablet TAKE 1 AND 1/2 TABLETS (0.75 MG TOTAL) BY MOUTH AT BEDTIME. 45 tablet 5   ezetimibe  (ZETIA ) 10 MG tablet TAKE 1 TABLET BY MOUTH EVERY DAY 90 tablet 0   metoprolol  tartrate  (LOPRESSOR ) 25 MG tablet TAKE 1/2 TABLET TWICE A DAY BY MOUTH 90 tablet 0   rosuvastatin  (CRESTOR ) 40 MG tablet Take 1 tablet (40 mg total) by mouth daily.     Turmeric 500 MG CAPS Take 1 capsule by mouth daily.     No current facility-administered medications for this visit.    Social History   Socioeconomic History   Marital status: Married    Spouse name: [name]   Number of children: 1   Years of education: Bachelor's   Highest education level: Not on file  Occupational History   Not on file  Tobacco Use   Smoking status: Never   Smokeless tobacco: Never  Substance and Sexual Activity   Alcohol  use: No    Alcohol /week: 0.0 standard drinks of alcohol     Comment: rarely   Drug use: No   Sexual activity: Not on file  Other Topics Concern   Not on file  Social History Narrative   Patient is married ([name]) and lives at home with his wife.   Patient has one adult child.   Patient is working full-time.   Patient has a Bachelor's degree.   Patient is right-handed.   Patient drinks one cup of tea  daily.   Social Drivers of Corporate investment banker Strain: Not on file  Food Insecurity: Not on file  Transportation Needs: Not on file  Physical Activity: Not on file  Stress: Not on file  Social Connections: Not on file  Intimate Partner Violence: Not on file   Socially, he is married.  He works in Consulting civil engineer.  He is one child.  He exercises 5 days per week.  There is no tobacco use  Family History  Problem Relation Age of Onset   Cancer Mother        esophagus   Heart disease Mother    Hypertension Mother    Esophageal cancer Mother    Cancer Father        spinal    Colon cancer Neg Hx    Rectal cancer Neg Hx    Stomach cancer Neg Hx    ROS General: Negative; No fevers,  chills, or night sweats;  HEENT: Negative; No changes in vision or hearing, sinus congestion, difficulty swallowing Pulmonary: Negative; No cough, wheezing, shortness of breath,  hemoptysis Cardiovascular: Negative; No chest pain, presyncope, syncope, palpitations GI: Negative; No nausea, vomiting, diarrhea, or abdominal pain GU: Negative; No dysuria, hematuria, or difficulty voiding Musculoskeletal: Status post back surgery secondary to sciatica June 2020 in December 2020 Hematologic/Oncology: Negative; no easy bruising, bleeding Endocrine: Negative; no heat/cold intolerance; no diabetes Neuro: Negative; no changes in balance, headaches Skin: Negative; No rashes or skin lesions Psychiatric: Negative; No behavioral problems, depression Sleep: Positive for REM sleep behavior disorder/parasomnia; No snoring, daytime sleepiness, hypersomnolence, bruxism, restless legs, hypnogognic hallucinations, no cataplexy Other comprehensive 14 point system review is negative.   PE BP 112/74   Pulse (!) 52   Ht 6' (1.829 m)   Wt 197 lb 3.2 oz (89.4 kg)   SpO2 94%   BMI 26.75 kg/m    Repeat blood pressure by me was 112/68 supine and 98/66 standing  Wt Readings from Last 3 Encounters:  01/03/24 197 lb 3.2 oz (89.4 kg)  04/05/23 201 lb (91.2 kg)  12/08/22 203 lb 3.2 oz (92.2 kg)   General: Alert, oriented, no distress.  Skin: normal turgor, no rashes, warm and dry HEENT: Normocephalic, atraumatic. Pupils equal round and reactive to light; sclera anicteric; extraocular muscles intact;  Nose without nasal septal hypertrophy Mouth/Parynx benign; Mallinpatti scale 2 Neck: No JVD, no carotid bruits; normal carotid upstroke Lungs: clear to ausculatation and percussion; no wheezing or rales Chest wall: without tenderness to palpitation Heart: PMI not displaced, RRR, s1 s2 normal, 1/6 systolic murmur, no diastolic murmur, no rubs, gallops, thrills, or heaves Abdomen: soft, nontender; no hepatosplenomehaly, BS+; abdominal aorta nontender and not dilated by palpation. Back: no CVA tenderness Pulses 2+ Musculoskeletal: full range of motion, normal strength, no joint  deformities Extremities: no clubbing cyanosis or edema, Homan's sign negative  Neurologic: grossly nonfocal; Cranial nerves grossly wnl Psychologic: Normal mood and affect    EKG Interpretation Date/Time:  Wednesday January 03 2024 11:46:15 EDT Ventricular Rate:  52 PR Interval:  164 QRS Duration:  90 QT Interval:  418 QTC Calculation: 388 R Axis:   22  Text Interpretation: Sinus bradycardia When compared with ECG of 19-Apr-2011 04:16, Criteria for Inferior infarct are no longer Present T wave inversion no longer evident in Inferior leads Confirmed by Magnus Schuller ([id number]) on 01/03/2024 4:13:00 PM    December 20, 2021 ECG (independently read by me): Sinus bradycardia at 54  October 13, 2020 ECG (independently read by me): Sinus bradycardia at 49; no ectopy      January 2021 ECG (independently read by me): Normal sinus rhythm at 62 bpm.  Small nondiagnostic inferior Q waves with well-preserved R waves suggesting salvage of myocardium.  January 2020 ECG (independently read by me): Sinus Bradycardia at 47; Small inferiior Q waves with preserved R waves; normal intervals  June 07, 2018 ECG (independently read by me): Sinus bradycardia with PAC.  Normal intervals per no ST seg  November 2017 ECG (independently read by me): Sinus bradycardia 54 bpm.  ECG shows small inferior Q waves with preserved R waves.  May 2016 ECG (independently read by me): Sinus bradycardia 49 bpm.  No ectopy.  Normal intervals.  No ST segment changes.  April 2015 ECG today (Independently read by me): Sinus rhythm at 58 beats per minute.  No significant ST changes.  No evidence for prior myocardial infarction.  Prior 04/25/2013: ECG: Sinus  bradycardia at 53 beats per minute. Normal. Very diminutive nondiagnostic Q waves in II, III, and F with preserved R waves concordant with his myocardial salvage   LABS:    Latest Ref Rng & Units 04/13/2022    9:06 AM 04/19/2011    5:20 AM 04/17/2011    5:55 AM  BMP  Glucose 70 -  99 mg/dL 86  92  308   BUN 8 - 27 mg/dL 8  8  7    Creatinine 0.76 - 1.27 mg/dL 6.57  8.46  9.62   BUN/Creat Ratio 10 - 24 9     Sodium 134 - 144 mmol/L 135  137  135   Potassium 3.5 - 5.2 mmol/L 4.3  3.7  3.7   Chloride 96 - 106 mmol/L 99  104  105   CO2 20 - 29 mmol/L 24  25  24    Calcium  8.6 - 10.2 mg/dL 9.2  9.0  8.3       Latest Ref Rng & Units 04/13/2022    9:06 AM 04/16/2011    7:00 AM 04/16/2011   12:50 AM  Hepatic Function  Total Protein 6.0 - 8.5 g/dL 6.3  5.7  6.1   Albumin 3.9 - 4.9 g/dL 4.2  3.2  3.6   AST 0 - 40 IU/L 22  89  14   ALT 0 - 44 IU/L 18  18  11    Alk Phosphatase 44 - 121 IU/L 78  60  64   Total Bilirubin 0.0 - 1.2 mg/dL 0.7  0.4  0.4       Latest Ref Rng & Units 04/17/2011    5:00 AM 04/16/2011    7:00 AM 04/16/2011    1:04 AM  CBC  WBC 4.0 - 10.5 K/uL 11.1  13.2    Hemoglobin 13.0 - 17.0 g/dL 95.2  84.1  32.4   Hematocrit 39.0 - 52.0 % 37.1  36.7  41.0   Platelets 150 - 400 K/uL 134  162     No results found for: HGBA1C  Lab Results  Component Value Date   TSH 0.566 04/16/2011   Lipid Panel     Component Value Date/Time   CHOL 125 04/13/2022 0906   TRIG 66 04/13/2022 0906   HDL 56 04/13/2022 0906   CHOLHDL 2.2 04/13/2022 0906   CHOLHDL 3.4 04/16/2011 0700   VLDL 13 04/16/2011 0700   LDLCALC 55 04/13/2022 0906      RADIOLOGY: No results found.  IMPRESSION: 1. Acute coronary syndrome [facility]): September 2012   2. Essential hypertension   3. Elevated Lp(a)   4. Hyperlipidemia with target LDL less than 50   5. Bradycardia   6. RBD (REM behavioral disorder)    ASSESSMENT AND PLAN: [name] is a 73 year-old male who is almost 13 years following his acute coronary syndrome/inferior wall ST segment elevation myocardial infarction in 03/2011 which was complicated by recurrent episodes of ventricular fibrillation, cardiogenic shock which required multiple defibrillations. He has been demonstrated to have essentially complete salvage of  myocardium in the RCA territory.  He did not have concomitant CAD with his other coronaries being normal.  His last nuclear stress test was in 2017 which remained normal.  He has continued to do well from a cardiac standpoint and remains asymptomatic.  He has continued to be on metoprolol  tartrate 12.5 mg and baby aspirin. Laboratory from Dr. Mamie Searles in December 2022 showed total cholesterol 143, triglycerides 51, HDL 60 and LDL 73.  At that time, I recommended he discontinue niacin , continue rosuvastatin  40 mg and add Zetia  10 mg daily.  I recommended target LDL at least less than 55 and preferably less than 50.  LP(a) was checked in 2018 which was elevated at 148.  On subsequent evaluation improved to 121.  I suspect his elevated LP(a) may have led to vulnerable plaque contributing to his ACS in 2012.  Presently, he has been stable with reference to his night terrors on clonazepam  and there are no signs of development of Parkinson disease or Lewy body disorder.  He continues to be followed by Dr. Albertina Hugger.  He is aware of my imminent retirement.  I will transition him to the cardiology care of Dr. Alvis Ba with plan follow-up in 1 year or sooner as needed.  01/03/2024 4:27 PM

## 2024-01-24 DIAGNOSIS — H04123 Dry eye syndrome of bilateral lacrimal glands: Secondary | ICD-10-CM | POA: Diagnosis not present

## 2024-03-15 ENCOUNTER — Other Ambulatory Visit: Payer: Self-pay

## 2024-03-15 ENCOUNTER — Telehealth: Payer: Self-pay | Admitting: Cardiovascular Disease

## 2024-03-15 MED ORDER — EZETIMIBE 10 MG PO TABS: 10.0000 mg | ORAL_TABLET | Freq: Every day | ORAL | 2 refills | Status: AC

## 2024-03-15 MED ORDER — METOPROLOL TARTRATE 25 MG PO TABS: 12.5000 mg | ORAL_TABLET | Freq: Two times a day (BID) | ORAL | 2 refills | Status: AC

## 2024-03-15 NOTE — Telephone Encounter (Signed)
*  STAT* If patient is at the pharmacy, call can be transferred to refill team.   1. Which medications need to be refilled? (please list name of each medication and dose if known)   ezetimibe  (ZETIA ) 10 MG tablet     4. Which pharmacy/location (including street and city if local pharmacy) is medication to be sent to?  [id number] - [place], [place] - [address]     5. Do they need a 30 day or 90 day supply? 90   Pt states he is completely out

## 2024-03-15 NOTE — Telephone Encounter (Signed)
 Pt's medication was sent to pt's pharmacy as requested. Confirmation received.

## 2024-03-22 ENCOUNTER — Other Ambulatory Visit: Payer: Self-pay | Admitting: Neurology

## 2024-03-25 ENCOUNTER — Encounter: Payer: Self-pay | Admitting: Neurology

## 2024-03-26 MED ORDER — CLONAZEPAM 0.5 MG PO TABS: 0.7500 mg | ORAL_TABLET | Freq: Every day | ORAL | 5 refills | Status: DC

## 2024-04-04 ENCOUNTER — Ambulatory Visit: Payer: Medicare PPO | Admitting: Adult Health

## 2024-04-11 ENCOUNTER — Emergency Department (HOSPITAL_BASED_OUTPATIENT_CLINIC_OR_DEPARTMENT_OTHER)

## 2024-04-11 ENCOUNTER — Other Ambulatory Visit: Payer: Self-pay

## 2024-04-11 ENCOUNTER — Emergency Department (HOSPITAL_BASED_OUTPATIENT_CLINIC_OR_DEPARTMENT_OTHER)
Admission: EM | Admit: 2024-04-11 | Discharge: 2024-04-11 | Disposition: A | Discharge status: Home / Self Care | Attending: Emergency Medicine | Admitting: Emergency Medicine

## 2024-04-11 ENCOUNTER — Encounter: Payer: Self-pay | Admitting: Internal Medicine

## 2024-04-11 DIAGNOSIS — K838 Other specified diseases of biliary tract: Secondary | ICD-10-CM | POA: Diagnosis not present

## 2024-04-11 DIAGNOSIS — R112 Nausea with vomiting, unspecified: Secondary | ICD-10-CM | POA: Diagnosis not present

## 2024-04-11 DIAGNOSIS — R1084 Generalized abdominal pain: Secondary | ICD-10-CM | POA: Insufficient documentation

## 2024-04-11 DIAGNOSIS — R109 Unspecified abdominal pain: Secondary | ICD-10-CM | POA: Diagnosis not present

## 2024-04-11 DIAGNOSIS — Z7982 Long term (current) use of aspirin: Secondary | ICD-10-CM | POA: Diagnosis not present

## 2024-04-11 DIAGNOSIS — R1011 Right upper quadrant pain: Secondary | ICD-10-CM | POA: Diagnosis not present

## 2024-04-11 LAB — CBC WITH DIFFERENTIAL/PLATELET
Abs Immature Granulocytes: 0.02 K/uL (ref 0.00–0.07)
Basophils Absolute: 0.1 K/uL (ref 0.0–0.1)
Basophils Relative: 1 %
Eosinophils Absolute: 0.1 K/uL (ref 0.0–0.5)
Eosinophils Relative: 1 %
HCT: 43.9 % (ref 39.0–52.0)
Hemoglobin: 15.5 g/dL (ref 13.0–17.0)
Immature Granulocytes: 0 %
Lymphocytes Relative: 33 %
Lymphs Abs: 3.2 K/uL (ref 0.7–4.0)
MCH: 34.8 pg — ABNORMAL HIGH (ref 26.0–34.0)
MCHC: 35.3 g/dL (ref 30.0–36.0)
MCV: 98.7 fL (ref 80.0–100.0)
Monocytes Absolute: 0.8 K/uL (ref 0.1–1.0)
Monocytes Relative: 8 %
Neutro Abs: 5.3 K/uL (ref 1.7–7.7)
Neutrophils Relative %: 57 %
Platelets: 183 K/uL (ref 150–400)
RBC: 4.45 MIL/uL (ref 4.22–5.81)
RDW: 12.4 % (ref 11.5–15.5)
WBC: 9.4 K/uL (ref 4.0–10.5)
nRBC: 0 % (ref 0.0–0.2)

## 2024-04-11 LAB — URINALYSIS, ROUTINE W REFLEX MICROSCOPIC
Bacteria, UA: NONE SEEN
Bilirubin Urine: NEGATIVE
Glucose, UA: NEGATIVE mg/dL
Hgb urine dipstick: NEGATIVE
Ketones, ur: NEGATIVE mg/dL
Leukocytes,Ua: NEGATIVE
Nitrite: NEGATIVE
Protein, ur: 30 mg/dL — AB
Specific Gravity, Urine: >1.046 — ABNORMAL HIGH (ref 1.005–1.030)
pH: 7 (ref 5.0–8.0)

## 2024-04-11 LAB — COMPREHENSIVE METABOLIC PANEL WITH GFR
ALT: 19 U/L (ref 0–44)
AST: 27 U/L (ref 15–41)
Albumin: 4.7 g/dL (ref 3.5–5.0)
Alkaline Phosphatase: 78 U/L (ref 38–126)
Anion gap: 19 — ABNORMAL HIGH (ref 5–15)
BUN: 11 mg/dL (ref 8–23)
CO2: 19 mmol/L — ABNORMAL LOW (ref 22–32)
Calcium: 10.2 mg/dL (ref 8.9–10.3)
Chloride: 97 mmol/L — ABNORMAL LOW (ref 98–111)
Creatinine, Ser: 0.94 mg/dL (ref 0.61–1.24)
GFR, Estimated: >60 mL/min (ref >60)
Glucose, Bld: 128 mg/dL — ABNORMAL HIGH (ref 70–99)
Potassium: 3.4 mmol/L — ABNORMAL LOW (ref 3.5–5.1)
Sodium: 135 mmol/L (ref 135–145)
Total Bilirubin: 0.7 mg/dL (ref 0.0–1.2)
Total Protein: 7.2 g/dL (ref 6.5–8.1)

## 2024-04-11 LAB — LACTIC ACID, PLASMA
Lactic Acid, Venous: 4 mmol/L (ref 0.5–1.9)
Lactic Acid, Venous: 1.6 mmol/L (ref 0.5–1.9)

## 2024-04-11 LAB — LIPASE, BLOOD: Lipase: 47 U/L (ref 11–51)

## 2024-04-11 MED ORDER — SODIUM CHLORIDE 0.9 % IV BOLUS
1000.0000 mL | Freq: Once | INTRAVENOUS | Status: AC
  Administered 2024-04-11: 1000 mL via INTRAVENOUS

## 2024-04-11 MED ORDER — IOHEXOL 350 MG/ML SOLN
100.0000 mL | Freq: Once | INTRAVENOUS | Status: AC | PRN
  Administered 2024-04-11: 100 mL via INTRAVENOUS

## 2024-04-11 MED ORDER — HYDROMORPHONE HCL 1 MG/ML IJ SOLN
1.0000 mg | Freq: Once | INTRAMUSCULAR | Status: AC
  Administered 2024-04-11: 1 mg via INTRAVENOUS
  Filled 2024-04-11: qty 1

## 2024-04-11 MED ORDER — ONDANSETRON HCL 4 MG/2ML IJ SOLN
4.0000 mg | Freq: Once | INTRAMUSCULAR | Status: AC
Start: 2024-04-11 — End: 2024-04-11
  Administered 2024-04-11: 4 mg via INTRAVENOUS
  Filled 2024-04-11: qty 2

## 2024-04-11 NOTE — ED Provider Notes (Signed)
 Patient presenting with abdominal pain today that woke him from sleep.  Initial lactate was 4, normal CBC, CMP with anion gap of 19 most likely related to the elevated lactate.  CTA to rule out mesenteric ischemia is negative.  Patient got 1 dose of pain medication and IV fluids and reports pain has resolved. 8:32 AM On repeat evaluation patient is still pain-free.  Reports that he had a similar event earlier this year but just did not get as severe as this 1 and resolved spontaneously.  No prior abdominal surgeries.  On exam he has some minimal right upper quadrant tenderness and will do an ultrasound to ensure that this is not related to cholelithiasis.  9:28 AM US  with sludge but no stones or obstruction.  Repeat lactate normal at 1.6.  pt is still pain free.  Will po challenge and if tolerates will d/c home with GI follow up.   Doretha Folks, MD 04/11/24 1055

## 2024-04-11 NOTE — ED Notes (Signed)
Pt able to tolerate water.

## 2024-04-11 NOTE — ED Notes (Signed)
 To CT

## 2024-04-11 NOTE — ED Notes (Signed)
 Gave pt water for a PO challenge.

## 2024-04-11 NOTE — ED Provider Notes (Signed)
 [place] EMERGENCY DEPARTMENT AT [facility] Provider Note   CSN: [id number] Arrival date & time: 04/11/24  0413     Patient presents with: Abdominal Pain   [name] is a 73 y.o. male.   Patient presents to the emergency department for evaluation of abdominal pain.  Patient reports being awakened from sleep around 1 AM with diffuse mid and lower abdominal pain.  He has been able to urinate without difficulty.  He tried to have a bowel movement but it did not relieve the pain.  No nausea, vomiting, diarrhea.  No history of abdominal surgeries.  He reports a cardiac history but there is no chest pain, shortness of breath.       Prior to Admission medications   Medication Sig Start Date End Date Taking? Authorizing Provider  aspirin 81 MG tablet Take 81 mg by mouth daily.   Yes [provider]  cholecalciferol (VITAMIN D3) 25 MCG (1000 UNIT) tablet Take 1,000 Units by mouth daily. 10/05/15  Yes [provider]  clonazePAM  (KLONOPIN ) 0.5 MG tablet Take 1.5 tablets (0.75 mg total) by mouth at bedtime. 03/26/24  Yes Dohmeier, Dedra, MD  metoprolol  tartrate (LOPRESSOR ) 25 MG tablet Take 0.5 tablets (12.5 mg total) by mouth 2 (two) times daily. 03/15/24  Yes Emelia Josefa HERO, NP  rosuvastatin  (CRESTOR ) 40 MG tablet Take 1 tablet (40 mg total) by mouth daily. 07/19/17  Yes Burnard Debby LABOR, MD  Turmeric 500 MG CAPS Take 1 capsule by mouth daily.   Yes [provider]  ezetimibe  (ZETIA ) 10 MG tablet Take 1 tablet (10 mg total) by mouth daily. 03/15/24   Emelia Josefa HERO, NP    Allergies: Patient has no known allergies.    Review of Systems  Updated Vital Signs BP (!) 160/80   Pulse 71   Temp (!) 96.5 F (35.8 C) (Axillary)   Resp 15   Ht 6' (1.829 m)   Wt 90.7 kg   SpO2 100%   BMI 27.12 kg/m   Physical Exam Vitals and nursing note reviewed.  Constitutional:      General: He is not in acute distress.    Appearance: He is well-developed.   HENT:     Head: Normocephalic and atraumatic.     Mouth/Throat:     Mouth: Mucous membranes are moist.  Eyes:     General: Vision grossly intact. Gaze aligned appropriately.     Extraocular Movements: Extraocular movements intact.     Conjunctiva/sclera: Conjunctivae normal.  Cardiovascular:     Rate and Rhythm: Normal rate and regular rhythm.     Pulses: Normal pulses.     Heart sounds: Normal heart sounds, S1 normal and S2 normal. No murmur heard.    No friction rub. No gallop.  Pulmonary:     Effort: Pulmonary effort is normal. No respiratory distress.     Breath sounds: Normal breath sounds.  Abdominal:     Palpations: Abdomen is soft.     Tenderness: There is abdominal tenderness in the right lower quadrant, periumbilical area and left lower quadrant. There is no guarding or rebound.     Hernia: No hernia is present.  Musculoskeletal:        General: No swelling.     Cervical back: Full passive range of motion without pain, normal range of motion and neck supple. No pain with movement, spinous process tenderness or muscular tenderness. Normal range of motion.     Right lower leg: No edema.  Left lower leg: No edema.  Skin:    General: Skin is warm and dry.     Capillary Refill: Capillary refill takes less than 2 seconds.     Findings: No ecchymosis, erythema, lesion or wound.  Neurological:     Mental Status: He is alert and oriented to person, place, and time.     GCS: GCS eye subscore is 4. GCS verbal subscore is 5. GCS motor subscore is 6.     Cranial Nerves: Cranial nerves 2-12 are intact.     Sensory: Sensation is intact.     Motor: Motor function is intact. No weakness or abnormal muscle tone.     Coordination: Coordination is intact.  Psychiatric:        Mood and Affect: Mood normal.        Speech: Speech normal.        Behavior: Behavior normal.     (all labs ordered are listed, but only abnormal results are displayed) Labs Reviewed  CBC WITH  DIFFERENTIAL/PLATELET - Abnormal; Notable for the following components:      Result Value   MCH 34.8 (*)    All other components within normal limits  COMPREHENSIVE METABOLIC PANEL WITH GFR - Abnormal; Notable for the following components:   Potassium 3.4 (*)    Chloride 97 (*)    CO2 19 (*)    Glucose, Bld 128 (*)    Anion gap 19 (*)    All other components within normal limits  LACTIC ACID, PLASMA - Abnormal; Notable for the following components:   Lactic Acid, Venous 4.0 (*)    All other components within normal limits  LIPASE, BLOOD  URINALYSIS, ROUTINE W REFLEX MICROSCOPIC    EKG: EKG Interpretation Date/Time:  Thursday April 11 2024 04:26:02 EDT Ventricular Rate:  61 PR Interval:  145 QRS Duration:  108 QT Interval:  450 QTC Calculation: 454 R Axis:   50  Text Interpretation: Sinus rhythm Normal ECG Confirmed by Haze Lonni PARAS [phone]) on 04/11/2024 4:52:16 AM  Radiology: CT Angio Abd/Pel W and/or Wo Contrast Result Date: 04/11/2024 EXAM: CTA ABDOMEN AND PELVIS WITHOUT AND WITH CONTRAST 04/11/2024 05:16:49 AM TECHNIQUE: CTA images of the abdomen and pelvis without and with intravenous contrast. 100 mL of iohexol  (OMNIPAQUE ) 350 MG/ML injection was administered. Three-dimensional MIP/volume rendered formations were performed. Automated exposure control, iterative reconstruction, and/or weight based adjustment of the mA/kV was utilized to reduce the radiation dose to as low as reasonably achievable. COMPARISON: None available. CLINICAL HISTORY: Abdominal pain, evaluation for mesenteric ischemia. Patient reports abdominal pain that started around 1 AM. Patient reports nausea and vomiting. Denies blood in stool, diarrhea or constipation. No complications in voiding. Patient reports a MI 13 years ago with 1 stent. Takes 81 mg ASA and metoprolol  daily. FINDINGS: VASCULATURE: AORTA: No acute finding. No abdominal aortic aneurysm. No dissection. CELIAC TRUNK: No acute finding.  No occlusion or significant stenosis. SUPERIOR MESENTERIC ARTERY: No acute finding. No occlusion or significant stenosis. RENAL ARTERIES: No acute finding. No occlusion or significant stenosis. ILIAC ARTERIES: No acute finding. No occlusion or significant stenosis. LIVER: The liver is unremarkable. GALLBLADDER AND BILE DUCTS: Gallbladder is unremarkable. No biliary ductal dilatation. SPLEEN: The spleen is unremarkable. PANCREAS: The pancreas is unremarkable. ADRENAL GLANDS: Bilateral adrenal glands demonstrate no acute abnormality. KIDNEYS, URETERS AND BLADDER: No stones in the kidneys or ureters. No hydronephrosis. No perinephric or periureteral stranding. Urinary bladder is unremarkable. GI AND BOWEL: Stomach and duodenal sweep demonstrate no acute  abnormality. There is no bowel obstruction. No abnormal bowel wall thickening or distension. REPRODUCTIVE: Reproductive organs are unremarkable. PERITONEUM AND RETRPERITONEUM: No ascites or free air. LUNG BASE: No acute abnormality. LYMPH NODES: No lymphadenopathy. BONES AND SOFT TISSUES: There is mild-to-moderate dextroscoliosis of the lumbar spine and there is moderate chronic degenerative disc disease at L3-4 and L5-S1. No acute soft tissue abnormality. IMPRESSION: 1. No occlusion or hemodynamically significant stenosis of the arterial system of the abdomen or pelvis. 2. No AAA. 3. No aortic dissection. Electronically signed by: Evalene Coho MD 04/11/2024 05:39 AM EDT RP Workstation: [id number]     Procedures   Medications Ordered in the ED  sodium chloride  0.9 % bolus 1,000 mL (has no administration in time range)  HYDROmorphone  (DILAUDID ) injection 1 mg (1 mg Intravenous Given 04/11/24 0449)  ondansetron  (ZOFRAN ) injection 4 mg (4 mg Intravenous Given 04/11/24 0448)  iohexol  (OMNIPAQUE ) 350 MG/ML injection 100 mL (100 mLs Intravenous Contrast Given 04/11/24 0510)                                    Medical Decision Making Amount and/or Complexity  of Data Reviewed Labs: ordered. Decision-making details documented in ED Course. Radiology: ordered and independent interpretation performed. Decision-making details documented in ED Course. ECG/medicine tests: ordered and independent interpretation performed. Decision-making details documented in ED Course.  Risk Prescription drug management.   Differential Diagnosis considered includes, but not limited to: Appendicitis; colitis; diverticulitis; bowel obstruction; hernia; cystitis; nephrolithiasis; pyelonephritis; mesenteric ischemia.  Presents to the emergency department for evaluation of abdominal pain.  Patient complaining of fairly significant pain, appears uncomfortable at arrival.  Patient does have some tenderness across the lower and mid abdomen but no guarding or rebound.  Patient with no leukocytosis.  He does have an anion gap acidosis, secondary to increased lactic acid.  Patient sent to radiology for CT angiography of abdomen to evaluate for ischemic bowel, other considerations of intra-abdominal pathology causing his pain.  CT scan unremarkable.  Pain is significantly improved after a dose of Dilaudid .  Awaiting urinalysis.  Will give IV fluid bolus, repeat chemistries and lactic acid.     Final diagnoses:  Lower abdominal pain    ED Discharge Orders     None          Haze Lonni PARAS, MD 04/11/24 [phone]

## 2024-04-11 NOTE — ED Triage Notes (Addendum)
 Patient reports abdominal pain that started around 1am. Patient reports nausea and vomiting. Denies blood in stool, diarrhea or constipation. No complications in voiding. Patient reports a MI 13 years ago with 1 stent. Takes 81mg  ASA and metoprolol  daily.

## 2024-04-11 NOTE — ED Notes (Signed)
 Pain has improved. Rates abdominal pain 2/10. Denies nausea.

## 2024-04-11 NOTE — Discharge Instructions (Addendum)
 If you start noticing that you are getting this pain more frequently especially after eating they will need to look more closely at your gallbladder.  Also if you do have another attack you can try tylenol  or ibuprofen for pain early to see if that helps with the pain.  However if gets like it did today return to the ER. Also you GI doctors may want to do some more evaluation after you call them as well.  For the next few days avoid fatty or fried foods.  Or heavy foods with a lot of cream.   Your CAT scan today was normal and the ultrasound did show sludge in your gallbladder which is a non-specific finding but may need more testing if you start to get this pain more frequently.

## 2024-04-11 NOTE — ED Notes (Signed)
 Fluids were not finished yet. Will draw lactic when they are done.

## 2024-04-11 NOTE — ED Notes (Signed)
 Patient transported to Ultrasound

## 2024-04-19 ENCOUNTER — Encounter: Payer: Self-pay | Admitting: Podiatry

## 2024-05-07 ENCOUNTER — Ambulatory Visit: Admitting: Podiatry

## 2024-05-07 ENCOUNTER — Ambulatory Visit

## 2024-05-07 ENCOUNTER — Encounter: Payer: Self-pay | Admitting: Podiatry

## 2024-05-07 DIAGNOSIS — M65972 Unspecified synovitis and tenosynovitis, left ankle and foot: Secondary | ICD-10-CM

## 2024-05-07 DIAGNOSIS — D2372 Other benign neoplasm of skin of left lower limb, including hip: Secondary | ICD-10-CM

## 2024-05-07 DIAGNOSIS — M2012 Hallux valgus (acquired), left foot: Secondary | ICD-10-CM

## 2024-05-08 DIAGNOSIS — M65972 Unspecified synovitis and tenosynovitis, left ankle and foot: Secondary | ICD-10-CM | POA: Diagnosis not present

## 2024-05-08 DIAGNOSIS — M2012 Hallux valgus (acquired), left foot: Secondary | ICD-10-CM | POA: Diagnosis not present

## 2024-05-08 DIAGNOSIS — D2372 Other benign neoplasm of skin of left lower limb, including hip: Secondary | ICD-10-CM | POA: Diagnosis not present

## 2024-05-08 MED ORDER — DEXAMETHASONE SODIUM PHOSPHATE 120 MG/30ML IJ SOLN
2.0000 mg | Freq: Once | INTRAMUSCULAR | Status: AC
  Administered 2024-05-08: 2 mg via INTRA_ARTICULAR

## 2024-05-08 NOTE — Progress Notes (Signed)
 He presents today states that the right foot that had surgery gets a lot of tingling throughout the day worse as it swells.  He is status post [facility] bunion repair right.  States that the tingling is right in here as he points to the second metatarsal phalangeal joint dorsally and plantarly.  As he refers to his left foot there is a painful callus subfifth metatarsal head of that left foot.  He points that it stating this hurts more than anything.  Objective: Vital signs are stable oriented x 3 pulses are palpable.  He has some tenderness on palpation around the second metatarsal phalangeal joint with mild edema.  Has good range of motion of the first metatarsophalangeal joint.  He states that the neuritis seems to have started as he is compensating for his left foot.  Left foot does demonstrate a benign neoplasm to the plantar aspect fifth metatarsal head of the left foot with an underlying bursitis.  Assessment: Well-healed surgical foot right with some residual capsulitis and neuritis second metatarsal phalangeal joint.  Benign skin lesion plantar aspect fifth met head with bursitis.  Plan: I injected dexamethasone  after sterile Betadine skin prep 4 mg fifth metatarsal head.  I was also able to enucleate benign skin lesion plantar aspect of the left foot.  We discussed appropriate shoe gear and how the shoe should fit to help prevent this irritation from occurring again.

## 2024-05-14 ENCOUNTER — Encounter: Payer: Self-pay | Admitting: Neurology

## 2024-05-14 ENCOUNTER — Ambulatory Visit: Admitting: Neurology

## 2024-05-14 ENCOUNTER — Ambulatory Visit: Admitting: Podiatry

## 2024-05-14 VITALS — BP 122/80 | HR 68 | Ht 72.0 in | Wt 197.8 lb

## 2024-05-14 DIAGNOSIS — R251 Tremor, unspecified: Secondary | ICD-10-CM

## 2024-05-14 DIAGNOSIS — G4752 REM sleep behavior disorder: Secondary | ICD-10-CM

## 2024-05-14 DIAGNOSIS — R6889 Other general symptoms and signs: Secondary | ICD-10-CM

## 2024-05-14 MED ORDER — CLONAZEPAM 0.5 MG PO TABS: 0.7500 mg | ORAL_TABLET | Freq: Every day | ORAL | 5 refills | Status: AC

## 2024-05-14 NOTE — Progress Notes (Signed)
 Provider:  Dedra Gores, MD  Primary Care Physician:  Onita Rush, MD [address] [place] [address]     Referring Provider: Onita Rush, Md [address] [place],  [place] [address]          Chief Complaint according to patient   Patient presents with:          Patient is here alone for sleep - no change or concerns  REM BD controlled on meds, no parkinsonism.  ESS - 6  FSS - 14       HISTORY OF PRESENT ILLNESS:  [name] is a 73 y.o. male patient who is here for revisit 05/14/2024 for REM BD thus far without  any  signs of PD,   And without cognitive  decline.  Stabile gait.  No masked face, no dysphonia.  Walks 3 miles a day.   His therapy dog [name] visits twice a week [facility] with him and he owns a cat ,58 years of age.   He just celebrated the 13s anniversary. [name] is a 73 y.o. male patient who is here for revisit 04/05/2023 for REM BD. he has a history of CAD.  Chief concern according to patient :  none  He is working part time and considers himself retired. 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         02-21-2022: RV Rm 10, alone. Here for yearly f/u for RBD, medication refills and memory trends .Pt has retired but does continues to work part -time. Memory remains the same. No changes in sleep pattern and no tremor. MOCA:29/ 30 points. He travels a lot with his wife.        Interval history: 02-18-2021:  happily retired , Lawyer present for RV, followed since 2014 for REM BD and to screen for Lewy body or PD, and today reports sciatica in the right , arising from the buttock, hip and back to lower right leg. Dr.Cram  is following, MRI is scheduled. Pain shooting down, no numbness in the toes.  MOCA : 28/ 30 , all visio- spatial tasks perfect  No cogwheeling and mild action tremor.      Interval history - 02-19-2020: He will be officially retired as of 07-17-2020. He  has been at peace with his decision and is looking forward to new goals and interests. 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 .  No changes in sleep pattern and no tremor, or any PD related symptoms.      Interval history : 02-13-2019, [name] is a 73 y.o. caucasian, married, right handed male , [name] is a 73 year old right handed caucasian patient. Parasomnia remain controlled.  He had a flair up after surgery, when he had to take pain medication and muscle relaxants and he kicked with a vengeance while holding the Clonazepam . He has sciatica and still has some, L4-5 nerve was pinched.  He remains in pain, on the right. His MRI from yesterday showed swelling on the radix nervi.he will have a steroid injection in the next 2 weeks.  As he returned to clonazepam  he immediately slept better and his REM BD/ night terrors have been controlled. He takes also tart cherry extract and tumeric.  BTW:he still works full time.         Review of Systems: Out of a  complete 14 system review, the patient complains of only the following symptoms, and all other reviewed systems are negative.:   SLEEPINESS ?  How likely are you to doze in the following situations: 0 = not likely, 1 = slight chance, 2 = moderate chance, 3 = high chance  Sitting and Reading? Watching Television? Sitting inactive in a public place (theater or meeting)? Lying down in the afternoon when circumstances permit? Sitting and talking to someone? Sitting quietly after lunch without alcohol ? In a car, while stopped for a few minutes in traffic? As a passenger in a car for an hour without a break?  Total = 6/ FSS at 14/ 63        Social History   Socioeconomic History   Marital status: Married    Spouse name: [name]   Number of children: 1   Years of education: Energy Manager   Highest education level: Not on file  Occupational History   Not on file  Tobacco Use   Smoking status: Never    Smokeless tobacco: Never  Substance and Sexual Activity   Alcohol  use: Yes    Comment: glass of wine a night   Drug use: No   Sexual activity: Not on file  Other Topics Concern   Not on file  Social History Narrative   Patient is married ([name]) and lives at home with his wife.   Patient has one adult child.   Patient is working full-time.   Patient has a Bachelor's degree.   Patient is right-handed.   Patient drinks one cup of coffee in the morning and tea in the evening daily.   Social Drivers of Corporate Investment Banker Strain: Not on file  Food Insecurity: Not on file  Transportation Needs: Not on file  Physical Activity: Not on file  Stress: Not on file  Social Connections: Not on file    Family History  Problem Relation Age of Onset   Cancer Mother        esophagus   Heart disease Mother    Hypertension Mother    Esophageal cancer Mother    Cancer Father        spinal    Stroke Other    Colon cancer Neg Hx    Rectal cancer Neg Hx    Stomach cancer Neg Hx    Seizures Neg Hx    Migraines Neg Hx    Sleep apnea Neg Hx     Past Medical History:  Diagnosis Date   CAD (coronary artery disease)    a. Inf MI/PCI: 100% pRCA (4.0x22 Integrity BMS->dil to 4.56mm)-->MI complicated by VF and CGS;  b. 04/2012 MV: EF 65%, inf attenuation, no ischemia.   Colon polyps    adenomatous   Diverticulosis    Hyperlipidemia    Hypertensive heart disease    RBD (REM behavioral disorder) 02/14/2014    Past Surgical History:  Procedure Laterality Date   broken wrist     COLONOSCOPY     CORONARY STENT PLACEMENT     03/2011   foot surgery Right 09/2023   WISDOM TOOTH EXTRACTION       Current Outpatient Medications on File Prior to Visit  Medication Sig Dispense Refill   aspirin 81 MG tablet Take 81 mg by mouth daily.     cholecalciferol (VITAMIN D3) 25 MCG (1000 UNIT) tablet Take 1,000 Units by mouth daily.     clonazePAM  (KLONOPIN ) 0.5 MG tablet Take 1.5 tablets (0.75  mg total)  by mouth at bedtime. 45 tablet 5   ezetimibe  (ZETIA ) 10 MG tablet Take 1 tablet (10 mg total) by mouth daily. 90 tablet 2   metoprolol  tartrate (LOPRESSOR ) 25 MG tablet Take 0.5 tablets (12.5 mg total) by mouth 2 (two) times daily. 90 tablet 2   rosuvastatin  (CRESTOR ) 40 MG tablet Take 1 tablet (40 mg total) by mouth daily.     Turmeric 500 MG CAPS Take 1 capsule by mouth daily.     No current facility-administered medications on file prior to visit.    No Known Allergies   DIAGNOSTIC DATA (LABS, IMAGING, TESTING) - I reviewed patient records, labs, notes, testing and imaging myself where available.  Lab Results  Component Value Date   WBC 9.4 04/11/2024   HGB 15.5 04/11/2024   HCT 43.9 04/11/2024   MCV 98.7 04/11/2024   PLT 183 04/11/2024      Component Value Date/Time   NA 135 04/11/2024 0430   NA 135 04/13/2022 0906   K 3.4 (L) 04/11/2024 0430   CL 97 (L) 04/11/2024 0430   CO2 19 (L) 04/11/2024 0430   GLUCOSE 128 (H) 04/11/2024 0430   BUN 11 04/11/2024 0430   BUN 8 04/13/2022 0906   CREATININE 0.94 04/11/2024 0430   CALCIUM  10.2 04/11/2024 0430   PROT 7.2 04/11/2024 0430   PROT 6.3 04/13/2022 0906   ALBUMIN 4.7 04/11/2024 0430   ALBUMIN 4.2 04/13/2022 0906   AST 27 04/11/2024 0430   ALT 19 04/11/2024 0430   ALKPHOS 78 04/11/2024 0430   BILITOT 0.7 04/11/2024 0430   BILITOT 0.7 04/13/2022 0906   GFRNONAA >60 04/11/2024 0430   GFRAA >90 04/19/2011 0520   Lab Results  Component Value Date   CHOL 125 04/13/2022   HDL 56 04/13/2022   LDLCALC 55 04/13/2022   TRIG 66 04/13/2022   CHOLHDL 2.2 04/13/2022   No results found for: HGBA1C No results found for: VITAMINB12 Lab Results  Component Value Date   TSH 0.566 04/16/2011    PHYSICAL EXAM:  Vitals:   05/14/24 1518  BP: 122/80  Pulse: 68  SpO2: 98%   No data found. Body mass index is 26.83 kg/m.   Wt Readings from Last 3 Encounters:  05/14/24 197 lb 12.8 oz (89.7 kg)  04/11/24 200 lb  (90.7 kg)  01/03/24 197 lb 3.2 oz (89.4 kg)     Ht Readings from Last 3 Encounters:  05/14/24 6' (1.829 m)  04/11/24 6' (1.829 m)  01/03/24 6' (1.829 m)      General: The patient is awake, alert and appears not in acute distress and groomed. Head: Normocephalic, atraumatic.  Neck is supple. Mallampati 2,  neck circumference:14 inches .   Nasal airflow is patent.   Overbite Dwan is  seen.  Dental status:  biological  Cardiovascular:  Regular rate and cardiac rhythm by pulse, without distended neck veins. Respiratory: no shortness of breath  Skin:  Without evidence of ankle edema, or rash. Trunk: BMI is 26.8    NEUROLOGIC EXAM: The patient is awake and alert, oriented to place and time.   Memory subjective described as intact.  Attention span & concentration ability appears normal.   Speech is fluent,  without  dysarthria, dysphonia or aphasia.  Mood and affect are appropriate.   Neurological Examination: Mental Status: Intact. No cognitive deficits. Cranial Nerves II-XII: Intact. PERL. EOMI. VFF. No nystagmus. No dysarthia, dysphonia,   No facial droop.  No ptosis.  Hearing is grossly intact bilaterally.  The tongue is normal and midline. Motor: Strengths are 5/5 throughout. Muscle bulk and tone are normal. No tremors. No cog wheeling.  Coordination: No ataxia or dysmetria.  Sensory: Grossly intact throughout to all modalities. Reflexes: Normal and symmetric throughout. No ankle clonus. Babinski's sign is absent bilaterally. Hoffman's sign is absent bilaterally. Gait and Station: Normal. Romberg's sign is absent.   ASSESSMENT AND PLAN :   73 y.o. year old male  here with:    1) REM BD, refilled medication .   Yearly RV with MOCA and med refills.    I would like to thank Onita Rush, MD and Onita Rush, Md [address] [place],  [place] [address] for allowing me to meet with this pleasant patient.   Review and reiteration of good sleep hygiene  measures is offered to any sleep clinic patient, be it in the first consultation or with any follow up visits. Any patient with sleepiness should be cautioned not to drive, work at heights, or operate dangerous or heavy equipment when feeling tired or sleepy. The patient will be seen in follow-up in the sleep clinic at [facility] for discussion of test results, sleep related symptoms and treatment compliance review, further management strategies, etc.   The referring provider will be notified of the test results.   The patient's condition requires frequent monitoring and adjustments in the treatment plan, reflecting the ongoing complexity of care.  This provider is the continuing focal point for all needed services for this condition.  After spending a total time of  35  minutes face to face and time for  history taking, physical and neurologic examination, review of laboratory studies,  personal review of imaging studies, reports and results of other testing and review of referral information / records as far as provided in visit,   Electronically signed by: Dedra Gores, MD 05/14/2024 4:07 PM  [facility] Board certified by The [employer] of Sleep Medicine and Diplomate of the [employer] of Sleep Medicine. Board certified In Neurology through the ABPN, Fellow of the [employer] of Neu[employer].

## 2024-06-04 ENCOUNTER — Encounter: Payer: Self-pay | Admitting: Internal Medicine

## 2024-06-04 ENCOUNTER — Ambulatory Visit: Admitting: Internal Medicine

## 2024-06-04 VITALS — BP 126/60 | HR 59 | Ht 72.0 in | Wt 196.8 lb

## 2024-06-04 DIAGNOSIS — Z860100 Personal history of colon polyps, unspecified: Secondary | ICD-10-CM

## 2024-06-04 DIAGNOSIS — R109 Unspecified abdominal pain: Secondary | ICD-10-CM

## 2024-06-04 DIAGNOSIS — K828 Other specified diseases of gallbladder: Secondary | ICD-10-CM | POA: Diagnosis not present

## 2024-06-04 NOTE — Patient Instructions (Signed)
 Please follow up as needed.  _______________________________________________________  If your blood pressure at your visit was 140/90 or greater, please contact your primary care physician to follow up on this.  _______________________________________________________  If you are age 73 or older, your body mass index should be between 23-30. Your Body mass index is 26.69 kg/m. If this is out of the aforementioned range listed, please consider follow up with your Primary Care Provider.  If you are age 49 or younger, your body mass index should be between 19-25. Your Body mass index is 26.69 kg/m. If this is out of the aformentioned range listed, please consider follow up with your Primary Care Provider.   ________________________________________________________  The [facility] providers would like to encourage you to use MYCHART to communicate with providers for non-urgent requests or questions.  Due to long hold times on the telephone, sending your provider a message by [facility] may be a faster and more efficient way to get a response.  Please allow 48 business hours for a response.  Please remember that this is for non-urgent requests.  _______________________________________________________  [facility] is using a team-based approach to care.  Your team is made up of your doctor and two to three APPS. Our APPS (Nurse Practitioners and Physician Assistants) work with your physician to ensure care continuity for you. They are fully qualified to address your health concerns and develop a treatment plan. They communicate directly with your gastroenterologist to care for you. Seeing the Advanced Practice Practitioners on your physician's team can help you by facilitating care more promptly, often allowing for earlier appointments, access to diagnostic testing, procedures, and other specialty referrals.

## 2024-06-04 NOTE — Progress Notes (Signed)
 HISTORY OF PRESENT ILLNESS:  [name] is a 73 y.o. male, married retired head of IT for a [employer], with a history of hypertension, hyperlipidemia, and coronary artery disease who self-referred regarding recurrent lower abdominal pain.  I have seen the patient in the past for screening and surveillance colonoscopy.  He does have a history of diminutive adenoma.  Last colonoscopy 2016 revealed mild diverticulosis, but no neoplasia.  Follow-up in 10 years recommended.  In addition, he underwent upper endoscopy December 2013 to evaluate a globus sensation and a family history of esophageal cancer in his mother.  Examination was normal.  Patient tells me that he was in his usual state of health until about 6 or 7 months ago when he was awoken at 1 AM with lower abdominal pain.  He described it as cramp-like.  He took an acids and rested in a recliner.  He fell asleep and upon awakening felt better.  There were no other associated issues at that time such as nausea, vomiting, fever, or change in bowel habits.  His weight and appetite have been stable.  He had no further problems after that episode until April 11, 2024 when he presented to the emergency room after, again, being awoken with the same type of cramping lower abdominal pain.  Antacids were ineffective.  This episode was worse.  He did vomit once when he was at the emergency room.  He reported shortness of breath.  Extensive blood work was normal for unremarkable except for lactic acid level 4.  He underwent a CT angiogram of the abdomen and pelvis with and without contrast.  No significant abnormalities found.  Repeat lactic acid level 4 hours later was normal.  He did undergo abdominal ultrasound which revealed gallbladder sludge.  In all, his pain lasted about 6 hours.  He has had no problems since but he is seeking out a GI opinion regarding these episodes.  Does have a history of coronary artery disease with prior myocardial  infarction in 2013.  These recent problems with pain did not remind him in any way of the symptoms.    REVIEW OF SYSTEMS:  All non-GI ROS negative entirely  Past Medical History:  Diagnosis Date   CAD (coronary artery disease)    a. Inf MI/PCI: 100% pRCA (4.0x22 Integrity BMS->dil to 4.47mm)-->MI complicated by VF and CGS;  b. 04/2012 MV: EF 65%, inf attenuation, no ischemia.   Colon polyps    adenomatous   Diverticulosis    Forearm fracture    Childhood   Hyperlipidemia    Hypertensive heart disease    RBD (REM behavioral disorder) 02/14/2014    Past Surgical History:  Procedure Laterality Date   COLONOSCOPY     CORONARY STENT PLACEMENT     03/2011   foot surgery Right 09/2023   WISDOM TOOTH EXTRACTION      Social History [name]  reports that he has never smoked. He has never used smokeless tobacco. He reports current alcohol  use. He reports that he does not use drugs.  family history includes Cancer in his father and mother; Esophageal cancer in his mother; Heart disease in his mother; Hypertension in his mother; Stroke in an other family member.  No Known Allergies     PHYSICAL EXAMINATION: Vital signs: BP 126/60   Pulse (!) 59   Ht 6' (1.829 m)   Wt 196 lb 12.8 oz (89.3 kg)   BMI 26.69 kg/m   Constitutional: generally well-appearing, no acute  distress Psychiatric: alert and oriented x3, cooperative Eyes: extraocular movements intact, anicteric, conjunctiva pink Mouth: oral pharynx moist, no lesions Neck: supple no lymphadenopathy Cardiovascular: heart regular rate and rhythm, no murmur Lungs: clear to auscultation bilaterally Abdomen: soft, nontender, nondistended, no obvious ascites, no peritoneal signs, normal bowel sounds, no organomegaly Rectal: Omitted Extremities: no clubbing, cyanosis, or lower extremity edema bilaterally Skin: no lesions on visible extremities Neuro: No focal deficits.  Cranial nerves intact  ASSESSMENT:  1.  Episodic  self-limited severe lower abdominal cramping pain of uncertain etiology.  The only objective abnormalities found during his extensive evaluation were transient elevation of lactic acid and gallbladder sludge.  Completely asymptomatic since. 2.  History of nonadvanced adenoma.  Last colonoscopy 2016 3.  Family history of esophageal cancer.  Normal EGD 2013 4.  Multiple general medical problems.  Stable   PLAN:  1.  I discussed with him that I was uncertain of the cause of his infrequent self-limited episodes of pain.  We did discuss the prospects of colonoscopy and upper endoscopy to further evaluate, but as he is asymptomatic and otherwise feeling well, we mutually decided to follow him.  If he has further issues, he has been instructed to office.  Certainly, if severe pain returns, then reevaluation at the emergency facility was recommended. 2.  Routine surveillance colonoscopy around November 2026 3.  Ongoing general medical care with Dr. Onita Total time of 60 minutes was spent preparing to see the patient, obtaining comprehensive history, performing medically appropriate physical examination, counseling and educating the patient regarding the above listed issues, and documenting clinical information in the health record

## 2024-08-07 ENCOUNTER — Ambulatory Visit: Admitting: Neurology

## 2025-05-15 ENCOUNTER — Ambulatory Visit: Admitting: Neurology
